# Patient Record
Sex: Female | Born: 1985 | Hispanic: No | Marital: Married | Smoking: Never smoker
Health system: Southern US, Community
[De-identification: ages and names within clinical notes are randomized; demographics above are authoritative.]

## PROBLEM LIST (undated history)

## (undated) DIAGNOSIS — Z789 Other specified health status: Secondary | ICD-10-CM

## (undated) HISTORY — DX: Other specified health status: Z78.9

## (undated) HISTORY — PX: NO PAST SURGERIES: SHX2092

---

## 2021-07-06 NOTE — L&D Delivery Note (Signed)
Delivery Note FHR 140s, moderate variability, occ variable decel.  Around midnight, pt was noted to be C/C/+2 station. I came in the room, lifted the covers, and baby's head was crowning.  After one push, at 12:04 AM a viable female was delivered via Vaginal, Spontaneous (Presentation:   Occiput Anterior).  APGAR:8/9, ; weight pending.  After 1 minute, the cord was clamped and cut. 20 units of pitocin diluted in 500cc LR was infused rapidly IV.  The placenta separated spontaneously and delivered via CCT and maternal pushing effort.  It was inspected and appears to be intact with a 3 VC  Anesthesia: Epidural Episiotomy: None Lacerations: none  Suture Repair:  Est. Blood Loss (mL):  375  Mom to postpartum.  Baby to Couplet care / Skin to Skin.  Sheena Lloyd 01/23/2022, 12:36 AM   .

## 2021-09-02 ENCOUNTER — Ambulatory Visit (INDEPENDENT_AMBULATORY_CARE_PROVIDER_SITE_OTHER): Payer: Self-pay | Admitting: Nurse Practitioner

## 2021-09-02 ENCOUNTER — Encounter: Payer: Self-pay | Admitting: Nurse Practitioner

## 2021-09-02 ENCOUNTER — Other Ambulatory Visit (HOSPITAL_COMMUNITY)
Admission: RE | Admit: 2021-09-02 | Discharge: 2021-09-02 | Disposition: A | Discharge status: Home / Self Care | Payer: Self-pay | Source: Ambulatory Visit | Attending: Student | Admitting: Student

## 2021-09-02 ENCOUNTER — Other Ambulatory Visit: Payer: Self-pay

## 2021-09-02 VITALS — BP 124/72 | HR 102 | Wt 205.0 lb

## 2021-09-02 DIAGNOSIS — O0990 Supervision of high risk pregnancy, unspecified, unspecified trimester: Secondary | ICD-10-CM | POA: Insufficient documentation

## 2021-09-02 DIAGNOSIS — Z3A14 14 weeks gestation of pregnancy: Secondary | ICD-10-CM

## 2021-09-02 DIAGNOSIS — O09521 Supervision of elderly multigravida, first trimester: Secondary | ICD-10-CM

## 2021-09-03 LAB — CBC/D/PLT+RPR+RH+ABO+RUBIGG...
Antibody Screen: NEGATIVE
Basophils Absolute: 0 10*3/uL (ref 0.0–0.2)
Basos: 0 %
EOS (ABSOLUTE): 0.1 10*3/uL (ref 0.0–0.4)
Eos: 1 %
HCV Ab: NONREACTIVE
HIV Screen 4th Generation wRfx: NONREACTIVE
Hematocrit: 36.6 % (ref 34.0–46.6)
Hemoglobin: 12.5 g/dL (ref 11.1–15.9)
Hepatitis B Surface Ag: NEGATIVE
Immature Grans (Abs): 0.1 10*3/uL (ref 0.0–0.1)
Immature Granulocytes: 1 %
Lymphocytes Absolute: 1.3 10*3/uL (ref 0.7–3.1)
Lymphs: 16 %
MCH: 31 pg (ref 26.6–33.0)
MCHC: 34.2 g/dL (ref 31.5–35.7)
MCV: 91 fL (ref 79–97)
Monocytes Absolute: 0.7 10*3/uL (ref 0.1–0.9)
Monocytes: 9 %
Neutrophils Absolute: 5.7 10*3/uL (ref 1.4–7.0)
Neutrophils: 73 %
Platelets: 269 10*3/uL (ref 150–450)
RBC: 4.03 x10E6/uL (ref 3.77–5.28)
RDW: 12.6 % (ref 11.7–15.4)
RPR Ser Ql: NONREACTIVE
Rh Factor: POSITIVE
Rubella Antibodies, IGG: 2.81 index (ref >0.99)
WBC: 7.9 10*3/uL (ref 3.4–10.8)

## 2021-09-03 LAB — HCV INTERPRETATION

## 2021-09-03 LAB — HEMOGLOBIN A1C
Est. average glucose Bld gHb Est-mCnc: 97 mg/dL
Hgb A1c MFr Bld: 5 % (ref 4.8–5.6)

## 2021-09-03 LAB — GC/CHLAMYDIA PROBE AMP (~~LOC~~) NOT AT ARMC
Chlamydia: NEGATIVE
Comment: NEGATIVE
Comment: NORMAL
Neisseria Gonorrhea: NEGATIVE

## 2021-09-03 NOTE — Progress Notes (Signed)
Subjective:   Sheena Lloyd is a 36 y.o. G4P3003 at [redacted]w[redacted]d by LMP being seen today for her first obstetrical visit.  Her obstetrical history is significant for advanced maternal age. Patient does intend to breast feed. Pregnancy history fully reviewed.  Patient reports no complaints.  HISTORY: OB History  Gravida Para Term Preterm AB Living  4 3 3  0 0 3  SAB IAB Ectopic Multiple Live Births  0 0 0 0 0    # Outcome Date GA Lbr Len/2nd Weight Sex Delivery Anes PTL Lv  4 Current           3 Term 07/03/14 [redacted]w[redacted]d   F Vag-Spont None    2 Term 09/17/10 [redacted]w[redacted]d   M Vag-Spont None    1 Term 12/14/05 [redacted]w[redacted]d   M Vag-Spont None     History reviewed. No pertinent past medical history. History reviewed. No pertinent surgical history. History reviewed. No pertinent family history. Social History   Tobacco Use   Smoking status: Never   Smokeless tobacco: Never  Substance Use Topics   Alcohol use: Never   Drug use: Never   No Known Allergies No current outpatient medications on file prior to visit.   No current facility-administered medications on file prior to visit.     Exam   Vitals:   09/02/21 1512  BP: 124/72  Pulse: (!) 102  Weight: 205 lb (93 kg)   Fetal Heart Rate (bpm): 160  Uterus:   19 week size  Pelvic Exam: Perineum: no hemorrhoids, normal perineum   Vulva: normal external genitalia, no lesions   Vagina:  normal mucosa, normal discharge   Cervix: no lesions and normal, pap smear done. Cervix friable   Adnexa: normal adnexa and no mass, fullness, tenderness   Bony Pelvis: average  System: General: well-developed, well-nourished female in no acute distress   Breast:  normal appearance, no masses or tenderness   Skin: normal coloration and turgor, no rashes   Neurologic: oriented, normal, negative, normal mood   Extremities: normal strength, tone, and muscle mass, ROM of all joints is normal   HEENT extraocular movement intact and sclera clear, anicteric    Mouth/Teeth deferred   Neck supple and no masses, normal thyroid   Cardiovascular: regular rate and rhythm   Respiratory:  no respiratory distress, normal breath sounds   Abdomen: soft, non-tender; no masses,  no organomegaly     Assessment:   Pregnancy: [id number] Patient Active Problem List   Diagnosis Date Noted   Supervision of high risk pregnancy, antepartum 09/02/2021     Plan:  1. Supervision of high risk pregnancy, antepartum Patient is [employer] Paper work from [employer] a Mom says that she is 18 weeks Patient reports LMP that makes her 14 weeks Fundal height is nearly to umbilicus and more consistent with 18-19 weeks Will get [place] now  - [place] MFM OB DETAIL +14 [place]; Future - Hemoglobin A1c - Genetic Screening - Culture, OB Urine - CBC/D/Plt+RPR+Rh+ABO+RubIgG... - Cytology - PAP( [place]) - GC/Chlamydia probe amp ([place])not at [facility] - [place] MFM OB COMP + 14 WK; Future  2. Advanced maternal age in multigravida, first trimester Genetic testing done  3. [redacted] weeks gestation of pregnancy    Initial labs drawn. Continue prenatal vitamins. Genetic Screening discussed, NIPS: ordered. Ultrasound discussed; fetal anatomic survey: ordered. Problem list reviewed and updated. The nature of [place] with multiple MDs and other Advanced Practice Providers  was explained to patient; also emphasized that residents, students are part of our team. Routine obstetric precautions reviewed. Return in about 4 weeks (around 09/30/2021) for in person ROB.  Total face-to-face time with patient: 40 minutes.  Over 50% of encounter was spent on counseling and coordination of care.     Earlie Server, FNP Family Nurse Practitioner, [facility] for [employer], [place] [facility] 09-02-21  5:00 pm

## 2021-09-05 LAB — CYTOLOGY - PAP
Comment: NEGATIVE
Comment: NEGATIVE
Diagnosis: UNDETERMINED — AB
HPV 16: NEGATIVE
HPV 18 / 45: NEGATIVE
High risk HPV: POSITIVE — AB

## 2021-09-06 LAB — URINE CULTURE, OB REFLEX

## 2021-09-06 LAB — CULTURE, OB URINE

## 2021-09-08 ENCOUNTER — Encounter: Payer: Self-pay | Admitting: Medical

## 2021-09-08 DIAGNOSIS — R8271 Bacteriuria: Secondary | ICD-10-CM | POA: Insufficient documentation

## 2021-09-12 ENCOUNTER — Other Ambulatory Visit: Payer: Self-pay | Admitting: *Deleted

## 2021-09-12 ENCOUNTER — Other Ambulatory Visit: Payer: Self-pay | Admitting: Family Medicine

## 2021-09-12 ENCOUNTER — Other Ambulatory Visit: Payer: Self-pay | Admitting: Nurse Practitioner

## 2021-09-12 ENCOUNTER — Other Ambulatory Visit: Payer: Self-pay

## 2021-09-12 ENCOUNTER — Ambulatory Visit: Payer: Self-pay | Attending: Nurse Practitioner

## 2021-09-12 ENCOUNTER — Ambulatory Visit: Payer: Self-pay | Admitting: *Deleted

## 2021-09-12 VITALS — BP 120/67 | HR 102 | Ht 61.0 in

## 2021-09-12 DIAGNOSIS — O99212 Obesity complicating pregnancy, second trimester: Secondary | ICD-10-CM

## 2021-09-12 DIAGNOSIS — O0990 Supervision of high risk pregnancy, unspecified, unspecified trimester: Secondary | ICD-10-CM

## 2021-09-12 DIAGNOSIS — E669 Obesity, unspecified: Secondary | ICD-10-CM

## 2021-09-12 DIAGNOSIS — O09522 Supervision of elderly multigravida, second trimester: Secondary | ICD-10-CM

## 2021-09-12 DIAGNOSIS — Z3A18 18 weeks gestation of pregnancy: Secondary | ICD-10-CM

## 2021-09-12 DIAGNOSIS — R8271 Bacteriuria: Secondary | ICD-10-CM

## 2021-09-12 DIAGNOSIS — Z362 Encounter for other antenatal screening follow-up: Secondary | ICD-10-CM

## 2021-09-30 ENCOUNTER — Encounter: Payer: Self-pay | Admitting: Family Medicine

## 2021-09-30 ENCOUNTER — Ambulatory Visit (INDEPENDENT_AMBULATORY_CARE_PROVIDER_SITE_OTHER): Payer: Self-pay | Admitting: Family Medicine

## 2021-09-30 ENCOUNTER — Other Ambulatory Visit: Payer: Self-pay

## 2021-09-30 VITALS — BP 115/71 | HR 94 | Wt 206.1 lb

## 2021-09-30 DIAGNOSIS — R87810 Cervical high risk human papillomavirus (HPV) DNA test positive: Secondary | ICD-10-CM

## 2021-09-30 DIAGNOSIS — O0990 Supervision of high risk pregnancy, unspecified, unspecified trimester: Secondary | ICD-10-CM

## 2021-09-30 DIAGNOSIS — R87610 Atypical squamous cells of undetermined significance on cytologic smear of cervix (ASC-US): Secondary | ICD-10-CM

## 2021-09-30 DIAGNOSIS — R8271 Bacteriuria: Secondary | ICD-10-CM

## 2021-09-30 NOTE — Patient Instructions (Signed)
BCCCP (Breast and Cervical Cancer Control Program)  [phone]   

## 2021-09-30 NOTE — Progress Notes (Signed)
? ?  Subjective:  ?Sheena Lloyd is a 36 y.o. G4P3003 at [redacted]w[redacted]d being seen today for ongoing prenatal care.  She is currently monitored for the following issues for this low-risk pregnancy and has Supervision of high risk pregnancy, antepartum; Group B streptococcal bacteriuria; and ASCUS with positive high risk HPV cervical on their problem list. ? ?Patient reports no complaints.  Contractions: Not present. Vag. Bleeding: None.  Movement: Present. Denies leaking of fluid.  ? ?The following portions of the patient's history were reviewed and updated as appropriate: allergies, current medications, past family history, past medical history, past social history, past surgical history and problem list. Problem list updated. ? ?Objective:  ? ?Vitals:  ? 09/30/21 1115  ?BP: 115/71  ?Pulse: 94  ?Weight: 206 lb 1.6 oz (93.5 kg)  ? ? ?Fetal Status: Fetal Heart Rate (bpm): 152   Movement: Present    ? ?General:  Alert, oriented and cooperative. Patient is in no acute distress.  ?Skin: Skin is warm and dry. No rash noted.   ?Cardiovascular: Normal heart rate noted  ?Respiratory: Normal respiratory effort, no problems with respiration noted  ?Abdomen: Soft, gravid, appropriate for gestational age. Pain/Pressure: Absent     ?Pelvic: Vag. Bleeding: None     ?Cervical exam deferred        ?Extremities: Normal range of motion.     ?Mental Status: Normal mood and affect. Normal behavior. Normal judgment and thought content.  ? ?Urinalysis:     ? ?Assessment and Plan:  ?Pregnancy: [id number] at [redacted]w[redacted]d ? ?1. Supervision of high risk pregnancy, antepartum ?BP and FHR normal ?Reviewed labs from last visit ? ?2. Group B streptococcal bacteriuria ?Noted on new OB labs ?Discussed with assistance of [place] interpreter ? ?3. ASCUS with positive high risk HPV cervical ?Reviewed chart, actually needs colposcopy ?Discussed pap smear and HPV result with assistance of [place] interpreter ?Given self pay will refer to BCCCP ? ?Preterm labor symptoms  and general obstetric precautions including but not limited to vaginal bleeding, contractions, leaking of fluid and fetal movement were reviewed in detail with the patient. ?Please refer to After Visit Summary for other counseling recommendations.  ?Return in about 4 weeks (around 10/28/2021) for [facility], ob visit. ? ? ?Clarnce Flock, MD ? ?

## 2021-10-06 ENCOUNTER — Other Ambulatory Visit: Payer: Self-pay

## 2021-10-06 ENCOUNTER — Ambulatory Visit: Payer: Self-pay

## 2021-10-13 ENCOUNTER — Ambulatory Visit: Payer: Self-pay | Admitting: *Deleted

## 2021-10-13 ENCOUNTER — Other Ambulatory Visit: Payer: Self-pay | Admitting: *Deleted

## 2021-10-13 ENCOUNTER — Encounter: Payer: Self-pay | Admitting: *Deleted

## 2021-10-13 ENCOUNTER — Ambulatory Visit: Payer: Self-pay | Attending: Obstetrics and Gynecology

## 2021-10-13 VITALS — BP 128/60 | HR 96

## 2021-10-13 DIAGNOSIS — O99212 Obesity complicating pregnancy, second trimester: Secondary | ICD-10-CM

## 2021-10-13 DIAGNOSIS — O09522 Supervision of elderly multigravida, second trimester: Secondary | ICD-10-CM

## 2021-10-13 DIAGNOSIS — O0990 Supervision of high risk pregnancy, unspecified, unspecified trimester: Secondary | ICD-10-CM

## 2021-10-13 DIAGNOSIS — R8271 Bacteriuria: Secondary | ICD-10-CM

## 2021-10-13 DIAGNOSIS — Z6841 Body Mass Index (BMI) 40.0 and over, adult: Secondary | ICD-10-CM

## 2021-10-13 DIAGNOSIS — E669 Obesity, unspecified: Secondary | ICD-10-CM

## 2021-10-13 DIAGNOSIS — Z362 Encounter for other antenatal screening follow-up: Secondary | ICD-10-CM

## 2021-10-13 DIAGNOSIS — Z3A23 23 weeks gestation of pregnancy: Secondary | ICD-10-CM

## 2021-10-28 ENCOUNTER — Encounter: Payer: Self-pay | Admitting: Family Medicine

## 2021-10-28 ENCOUNTER — Ambulatory Visit (INDEPENDENT_AMBULATORY_CARE_PROVIDER_SITE_OTHER): Payer: Self-pay | Admitting: Family Medicine

## 2021-10-28 VITALS — BP 107/69 | HR 86 | Wt 208.2 lb

## 2021-10-28 DIAGNOSIS — O0990 Supervision of high risk pregnancy, unspecified, unspecified trimester: Secondary | ICD-10-CM

## 2021-10-28 DIAGNOSIS — R87810 Cervical high risk human papillomavirus (HPV) DNA test positive: Secondary | ICD-10-CM

## 2021-10-28 DIAGNOSIS — R87610 Atypical squamous cells of undetermined significance on cytologic smear of cervix (ASC-US): Secondary | ICD-10-CM

## 2021-10-28 DIAGNOSIS — R8271 Bacteriuria: Secondary | ICD-10-CM

## 2021-10-28 NOTE — Patient Instructions (Signed)

## 2021-10-28 NOTE — Progress Notes (Signed)
? ?  Subjective:  ?Sheena Lloyd is a 36 y.o. G4P3003 at [redacted]w[redacted]d being seen today for ongoing prenatal care.  She is currently monitored for the following issues for this low-risk pregnancy and has Supervision of high risk pregnancy, antepartum; Group B streptococcal bacteriuria; and ASCUS with positive high risk HPV cervical on their problem list. ? ?Patient reports no complaints.  Contractions: Not present. Vag. Bleeding: None.  Movement: Present. Denies leaking of fluid.  ? ?The following portions of the patient's history were reviewed and updated as appropriate: allergies, current medications, past family history, past medical history, past social history, past surgical history and problem list. Problem list updated. ? ?Objective:  ? ?Vitals:  ? 10/28/21 0947  ?BP: 107/69  ?Pulse: 86  ?Weight: 208 lb 3.2 oz (94.4 kg)  ? ? ?Fetal Status: Fetal Heart Rate (bpm): 146   Movement: Present    ? ?General:  Alert, oriented and cooperative. Patient is in no acute distress.  ?Skin: Skin is warm and dry. No rash noted.   ?Cardiovascular: Normal heart rate noted  ?Respiratory: Normal respiratory effort, no problems with respiration noted  ?Abdomen: Soft, gravid, appropriate for gestational age. Pain/Pressure: Absent     ?Pelvic: Vag. Bleeding: None     ?Cervical exam deferred        ?Extremities: Normal range of motion.  Edema: None  ?Mental Status: Normal mood and affect. Normal behavior. Normal judgment and thought content.  ? ?Urinalysis:     ? ?Assessment and Plan:  ?Pregnancy: [id number] at [redacted]w[redacted]d ? ?1. Supervision of high risk pregnancy, antepartum ?BP and FHR normal ?Discussed fasting labs for next visit ? ?2. Group B streptococcal bacteriuria ?Ppx in labor ? ?3. ASCUS with positive high risk HPV cervical ?Refer to BCCCP post partum for colpo ? ?Preterm labor symptoms and general obstetric precautions including but not limited to vaginal bleeding, contractions, leaking of fluid and fetal movement were reviewed in detail  with the patient. ?Please refer to After Visit Summary for other counseling recommendations.  ?Return in 2 weeks (on 11/11/2021) for [facility], ob visit. ? ? ?Venora Maples, MD ? ?

## 2021-11-10 ENCOUNTER — Other Ambulatory Visit: Payer: Self-pay | Admitting: *Deleted

## 2021-11-10 DIAGNOSIS — O0990 Supervision of high risk pregnancy, unspecified, unspecified trimester: Secondary | ICD-10-CM

## 2021-11-12 ENCOUNTER — Ambulatory Visit (INDEPENDENT_AMBULATORY_CARE_PROVIDER_SITE_OTHER): Payer: Self-pay | Admitting: Obstetrics & Gynecology

## 2021-11-12 ENCOUNTER — Encounter: Payer: Self-pay | Admitting: Obstetrics & Gynecology

## 2021-11-12 ENCOUNTER — Other Ambulatory Visit: Payer: Self-pay

## 2021-11-12 VITALS — BP 110/62 | HR 88 | Wt 211.0 lb

## 2021-11-12 DIAGNOSIS — O0990 Supervision of high risk pregnancy, unspecified, unspecified trimester: Secondary | ICD-10-CM

## 2021-11-12 DIAGNOSIS — Z3A27 27 weeks gestation of pregnancy: Secondary | ICD-10-CM

## 2021-11-12 NOTE — Progress Notes (Signed)
? ?PRENATAL VISIT NOTE ? ?Subjective:  ?Sheena Lloyd is a 36 y.o. G4P3003 at [redacted]w[redacted]d being seen today for ongoing prenatal care.  Patient is Dutch-speaking only, phone interpreter used for this encounter. She is currently monitored for the following issues for this high-risk pregnancy and has Supervision of high risk pregnancy, antepartum; Group B streptococcal bacteriuria; and ASCUS with positive high risk HPV cervical on their problem list. ? ?Patient reports no complaints.  Contractions: Not present.  .  Movement: Present. Denies leaking of fluid.  ? ?The following portions of the patient's history were reviewed and updated as appropriate: allergies, current medications, past family history, past medical history, past social history, past surgical history and problem list.  ? ?Objective:  ? ?Vitals:  ? 11/12/21 0856  ?BP: 110/62  ?Pulse: 88  ?Weight: 211 lb (95.7 kg)  ? ? ?Fetal Status: Fetal Heart Rate (bpm):  132   Movement: Present    ? ?General:  Alert, oriented and cooperative. Patient is in no acute distress.  ?Skin: Skin is warm and dry. No rash noted.   ?Cardiovascular: Normal heart rate noted  ?Respiratory: Normal respiratory effort, no problems with respiration noted  ?Abdomen: Soft, gravid, appropriate for gestational age.  Pain/Pressure: Absent     ?Pelvic: Cervical exam deferred        ?Extremities: Normal range of motion.     ?Mental Status: Normal mood and affect. Normal behavior. Normal judgment and thought content.  ? ?Imaging: ?[place] MFM OB FOLLOW UP ? ?Result Date: 10/13/2021 ?----------------------------------------------------------------------  OBSTETRICS REPORT                       (Signed Final 10/13/2021 03:53 pm) ---------------------------------------------------------------------- Patient Info  ID #:       [id number]                          D.O.B.:  [birth year] (36 yrs)  Name:       Sheena Lloyd               Visit Date: 10/13/2021 03:31 pm  ---------------------------------------------------------------------- Performed By  Attending:        Johnell Comings MD         Ref. Address:     [address]                                                             [place], [place]  Performed By:     Jacob Moores BS,       Location[facility]         [facility], RVT  [facility] at                                                             [facility] for                                                             Women  Referred By:      [facility]                    for Women ---------------------------------------------------------------------- Orders  #  Description                           Code        Ordered By  1  [place] MFM OB FOLLOW UP                   [phone]    [place] ----------------------------------------------------------------------  #  Order #                     Accession #                Episode #  1  [id number]                   [id number]                 [id number] ---------------------------------------------------------------------- Indications  Obesity complicating pregnancy, second         O99.212  trimester (BMI 6)  Advanced maternal age multigravida 80+,        O2.522  second trimester (36)  [redacted] weeks gestation of pregnancy                Z3A.23  LR Nips ---------------------------------------------------------------------- Fetal Evaluation  Num Of Fetuses:         1  Fetal Heart Rate(bpm):  157  Cardiac Activity:       Observed  Presentation:           Breech  Placenta:               Anterior  P. Cord Insertion:      Not well visualized  Amniotic Fluid  AFI FV:      Within normal limits                              Largest Pocket(cm)                              3.7 ---------------------------------------------------------------------- Biometry  BPD:      53.5  mm     G. Age:  22w 2d         12  %    CI:         66.24   %    70 - 86  FL/HC:      19.1   %    19.2 - 20.8  HC:      210.8  mm     G. Age:  23w 1d         29  %    HC/AC:      1.12        1.05 - 1.21  AC:      188.5  mm     G. Age:  23w 4d         52  %    FL/BPD:     75.3   %    71 - 87  FL:       40.3  mm     G. Age:  23w 0d         30  %    FL/AC:      21.4   %    20 - 24  CER:      26.1  mm     G. Age:  23w 4d         82  %  LV:        8.2  mm  CM:        6.4  mm  Est. FW:     580  gm      1 lb 4 oz     42  % ---------------------------------------------------------------------- OB History  Gravidity:    4         Term:   3 ---------------------------------------------------------------------- Gestational Age  LMP:           20w 1d        Date:  05/25/21                 EDD:   03/01/22  U/S Today:     23w 0d                                        EDD:   02/09/22  Best:          23w 2d     Det. By:  U/S  (09/12/21)          EDD:   02/07/22 ---------------------------------------------------------------------- Anatomy  Cranium:               Appears normal         LVOT:                   Appears normal  Cavum:                 Appears normal         Aortic Arch:            Appears normal  Ventricles:            Appears normal         Ductal Arch:            Appears normal  Choroid Plexus:        Appears normal         Diaphragm:              Appears normal  Cerebellum:            Appears normal         Stomach:  Appears normal, left                                                                        sided  Posterior Fossa:       Appears normal         Abdomen:                Appears normal  Nuchal Fold:           Previously seen        Abdominal Wall:         Appears nml (cord                                                                        insert, abd wall)  Face:                  Appears normal         Cord Vessels:           Appears normal (3                         (orbits and  profile)                           vessel cord)  Lips:                  Previously seen        Kidneys:                Appear normal  Palate:                Not well visualized    Bladder:                Appears normal  Thoracic:              Appears normal         Spine:                  Previously seen  Heart:                 Appears normal         Upper Extremities:      Appears normal                         (4CH, axis, and                         situs)  RVOT:                  Appears normal         Lower Extremities:      Visualized  Other:  Female gender previously seen. VC, 3VV and 3VTV visualized. Nasal  bone, Lenses previously visualized. 5th digit visualized. Feet          visualized. Technically difficult due to maternal habitus and early GA. ---------------------------------------------------------------------- Cervix Uterus Adnexa  Cervix  Length:           3.45  cm.  Normal appearance by transabdominal scan.  Uterus  No abnormality visualized.  Right Ovary  Within normal limits.  Left Ovary  Within normal limits.  Cul De Sac  No free fluid seen.  Adnexa  No abnormality visualized. ---------------------------------------------------------------------- Comments  This patient was seen for a follow up exam as the views of  the fetal anatomy were unable to be fully visualized during  her last exam.  She denies any problems since her last exam.  She was informed that the fetal growth and amniotic fluid  level appears appropriate for her gestational age.  The views of the fetal anatomy were visualized today.  There  were no obvious anomalies noted.  The limitations of ultrasound in the detection of all anomalies  was discussed.  Due to advanced maternal age and maternal obesity with a  BMI of 38, a follow-up growth scan was scheduled in 6 weeks. ----------------------------------------------------------------------                   Johnell Comings, MD Electronically Signed Final Report   10/13/2021  03:53 pm ----------------------------------------------------------------------  ? ?Assessment and Plan:  ?Pregnancy: [id number] at [redacted]w[redacted]d ?1. [redacted] weeks gestation of pregnancy ?2. Supervision of high risk pregnancy, antepartum ?Third trimester labs checked,

## 2021-11-13 LAB — CBC
Hematocrit: 35.2 % (ref 34.0–46.6)
Hemoglobin: 11.9 g/dL (ref 11.1–15.9)
MCH: 31.1 pg (ref 26.6–33.0)
MCHC: 33.8 g/dL (ref 31.5–35.7)
MCV: 92 fL (ref 79–97)
Platelets: 234 10*3/uL (ref 150–450)
RBC: 3.83 x10E6/uL (ref 3.77–5.28)
RDW: 12.6 % (ref 11.7–15.4)
WBC: 7.8 10*3/uL (ref 3.4–10.8)

## 2021-11-13 LAB — GLUCOSE TOLERANCE, 2 HOURS W/ 1HR
Glucose, 1 hour: 117 mg/dL (ref 70–179)
Glucose, 2 hour: 95 mg/dL (ref 70–152)
Glucose, Fasting: 67 mg/dL — ABNORMAL LOW (ref 70–91)

## 2021-11-13 LAB — HIV ANTIBODY (ROUTINE TESTING W REFLEX): HIV Screen 4th Generation wRfx: NONREACTIVE

## 2021-11-13 LAB — RPR: RPR Ser Ql: NONREACTIVE

## 2021-11-24 ENCOUNTER — Other Ambulatory Visit: Payer: Self-pay | Admitting: *Deleted

## 2021-11-24 ENCOUNTER — Ambulatory Visit: Payer: Self-pay | Admitting: *Deleted

## 2021-11-24 ENCOUNTER — Encounter: Payer: Self-pay | Admitting: *Deleted

## 2021-11-24 ENCOUNTER — Ambulatory Visit: Payer: Self-pay | Attending: Obstetrics

## 2021-11-24 VITALS — BP 109/56 | HR 88

## 2021-11-24 DIAGNOSIS — Z6841 Body Mass Index (BMI) 40.0 and over, adult: Secondary | ICD-10-CM | POA: Insufficient documentation

## 2021-11-24 DIAGNOSIS — Z362 Encounter for other antenatal screening follow-up: Secondary | ICD-10-CM

## 2021-11-24 DIAGNOSIS — O09523 Supervision of elderly multigravida, third trimester: Secondary | ICD-10-CM

## 2021-11-24 DIAGNOSIS — O09522 Supervision of elderly multigravida, second trimester: Secondary | ICD-10-CM | POA: Insufficient documentation

## 2021-11-24 DIAGNOSIS — E669 Obesity, unspecified: Secondary | ICD-10-CM

## 2021-11-24 DIAGNOSIS — R8271 Bacteriuria: Secondary | ICD-10-CM | POA: Insufficient documentation

## 2021-11-24 DIAGNOSIS — O0990 Supervision of high risk pregnancy, unspecified, unspecified trimester: Secondary | ICD-10-CM

## 2021-11-24 DIAGNOSIS — Z6838 Body mass index (BMI) 38.0-38.9, adult: Secondary | ICD-10-CM

## 2021-11-24 DIAGNOSIS — O99213 Obesity complicating pregnancy, third trimester: Secondary | ICD-10-CM

## 2021-11-24 DIAGNOSIS — Z3A29 29 weeks gestation of pregnancy: Secondary | ICD-10-CM

## 2021-11-27 ENCOUNTER — Ambulatory Visit (INDEPENDENT_AMBULATORY_CARE_PROVIDER_SITE_OTHER): Payer: Self-pay | Admitting: Family Medicine

## 2021-11-27 ENCOUNTER — Encounter: Payer: Self-pay | Admitting: Family Medicine

## 2021-11-27 VITALS — BP 115/74 | HR 91 | Wt 214.2 lb

## 2021-11-27 DIAGNOSIS — Z23 Encounter for immunization: Secondary | ICD-10-CM

## 2021-11-27 DIAGNOSIS — O0990 Supervision of high risk pregnancy, unspecified, unspecified trimester: Secondary | ICD-10-CM

## 2021-11-27 DIAGNOSIS — R87810 Cervical high risk human papillomavirus (HPV) DNA test positive: Secondary | ICD-10-CM

## 2021-11-27 DIAGNOSIS — R8271 Bacteriuria: Secondary | ICD-10-CM

## 2021-11-27 DIAGNOSIS — R87610 Atypical squamous cells of undetermined significance on cytologic smear of cervix (ASC-US): Secondary | ICD-10-CM

## 2021-11-27 DIAGNOSIS — Z3A29 29 weeks gestation of pregnancy: Secondary | ICD-10-CM

## 2021-11-27 DIAGNOSIS — O99210 Obesity complicating pregnancy, unspecified trimester: Secondary | ICD-10-CM

## 2021-11-27 NOTE — Addendum Note (Signed)
Addended by: Marjo Bicker on: 11/27/2021 10:18 AM   Modules accepted: Orders

## 2021-11-27 NOTE — Progress Notes (Signed)
    Subjective:  Angelisse Collier is a 36 y.o. G4P3003 at [redacted]w[redacted]d being seen today for ongoing prenatal care.  She is currently monitored for the following issues for this low-risk pregnancy and has Supervision of high risk pregnancy, antepartum; Group B streptococcal bacteriuria; and ASCUS with positive high risk HPV cervical on their problem list.  Patient reports no complaints.  Contractions: Not present. Vag. Bleeding: None.  Movement: Present. Denies leaking of fluid.   The following portions of the patient's history were reviewed and updated as appropriate: allergies, current medications, past family history, past medical history, past social history, past surgical history and problem list.   Objective:   Vitals:   11/27/21 0849  BP: 115/74  Pulse: 91  Weight: 214 lb 3.2 oz (97.2 kg)    Fetal Status: Fetal Heart Rate (bpm): 140 Fundal Height: 29 cm Movement: Present   Feels vertex by leopolds (transverse in last [place])   General:  Alert, oriented and cooperative. Patient is in no acute distress.  Skin: Skin is warm and dry. No rash noted.   Cardiovascular: Normal heart rate noted  Respiratory: Normal respiratory effort, no problems with respiration noted  Abdomen: Soft, gravid, appropriate for gestational age. Pain/Pressure: Present     Pelvic:  Cervical exam deferred        Extremities: Normal range of motion.  Edema: None  Mental Status: Normal mood and affect. Normal behavior. Normal judgment and thought content.   Assessment and Plan:  Pregnancy: G4P3003 at [redacted]w[redacted]d  1. Supervision of high risk pregnancy, antepartum Doing well.  2. [redacted] weeks gestation of pregnancy TDAP given today. Discussed normal 3rd trimester labs.   3. Group B streptococcal bacteriuria PCN in labor.   4. ASCUS with positive high risk HPV cervical Needs pp colpo.   5. BMI 40 Pre-gavid 38. Follow OB US for 6/26.   Preterm labor symptoms and general obstetric precautions including but not limited to  vaginal bleeding, contractions, leaking of fluid and fetal movement were reviewed in detail with the patient. Please refer to After Visit Summary for other counseling recommendations.   Return in about 2 weeks (around 12/11/2021) for [place].   Patriciaann Clan, DO

## 2021-12-09 NOTE — Progress Notes (Signed)
   PRENATAL VISIT NOTE  Subjective:  Sheena Lloyd is a 36 y.o. G4P3003 at [redacted]w[redacted]d being seen today for ongoing prenatal care.  She is currently monitored for the following issues for this low-risk pregnancy and has Supervision of high risk pregnancy, antepartum; Group B streptococcal bacteriuria; and ASCUS with positive high risk HPV cervical on their problem list.  Patient reports no complaints.  Contractions: Not present. Vag. Bleeding: None.  Movement: Present. Denies leaking of fluid.   The following portions of the patient's history were reviewed and updated as appropriate: allergies, current medications, past family history, past medical history, past social history, past surgical history and problem list.   Objective:   Vitals:   12/11/21 1052  BP: (!) 107/53  Pulse: 97  Weight: 213 lb (96.6 kg)    Fetal Status: Fetal Heart Rate (bpm): 156 Fundal Height: 32 cm Movement: Present     General:  Alert, oriented and cooperative. Patient is in no acute distress.  Skin: Skin is warm and dry. No rash noted.   Cardiovascular: Normal heart rate noted  Respiratory: Normal respiratory effort, no problems with respiration noted  Abdomen: Soft, gravid, appropriate for gestational age.  Pain/Pressure: Absent     Pelvic: Cervical exam deferred        Extremities: Normal range of motion.  Edema: None  Mental Status: Normal mood and affect. Normal behavior. Normal judgment and thought content.   Assessment and Plan:  Pregnancy: G4P3003 at [redacted]w[redacted]d  1. [redacted] weeks gestation of pregnancy   2. Supervision of high risk pregnancy, antepartum     -confirmed pediatrician from [employer] list -she would like to take POPs pp -anticipatory guidance for next visit   Preterm labor symptoms and general obstetric precautions including but not limited to vaginal bleeding, contractions, leaking of fluid and fetal movement were reviewed in detail with the patient. Please refer to After Visit Summary for  other counseling recommendations.   Return in about 3 weeks (around 01/01/2022), or LROb.  Future Appointments  Date Time Provider Department Center  12/25/2021 10:55 AM Warner Mccreedy, MD [facility]  12/29/2021 11:15 AM [facility] NURSE [facility]  12/29/2021 11:30 AM [facility] US3 [facility]  01/16/2022 10:15 AM Carlynn Herald, CNM [facility]  01/23/2022 10:35 AM Carlynn Herald, CNM [facility]  01/30/2022 10:35 AM Reva Bores, MD [facility]  02/05/2022  9:35 AM Bernerd Limbo, CNM [facility]    Marylene Land, CNM

## 2021-12-11 ENCOUNTER — Ambulatory Visit (INDEPENDENT_AMBULATORY_CARE_PROVIDER_SITE_OTHER): Payer: Self-pay | Admitting: Student

## 2021-12-11 VITALS — BP 107/53 | HR 97 | Wt 213.0 lb

## 2021-12-11 DIAGNOSIS — O0990 Supervision of high risk pregnancy, unspecified, unspecified trimester: Secondary | ICD-10-CM

## 2021-12-11 DIAGNOSIS — Z3A31 31 weeks gestation of pregnancy: Secondary | ICD-10-CM

## 2021-12-25 ENCOUNTER — Other Ambulatory Visit: Payer: Self-pay

## 2021-12-25 ENCOUNTER — Ambulatory Visit (INDEPENDENT_AMBULATORY_CARE_PROVIDER_SITE_OTHER): Payer: Self-pay | Admitting: Family Medicine

## 2021-12-25 VITALS — BP 118/67 | HR 96 | Wt 214.0 lb

## 2021-12-25 DIAGNOSIS — Z3A33 33 weeks gestation of pregnancy: Secondary | ICD-10-CM

## 2021-12-25 DIAGNOSIS — O0990 Supervision of high risk pregnancy, unspecified, unspecified trimester: Secondary | ICD-10-CM

## 2021-12-25 DIAGNOSIS — Z3009 Encounter for other general counseling and advice on contraception: Secondary | ICD-10-CM

## 2021-12-29 ENCOUNTER — Encounter: Payer: Self-pay | Admitting: *Deleted

## 2021-12-29 ENCOUNTER — Other Ambulatory Visit: Payer: Self-pay | Admitting: *Deleted

## 2021-12-29 ENCOUNTER — Ambulatory Visit: Payer: Self-pay | Admitting: *Deleted

## 2021-12-29 ENCOUNTER — Ambulatory Visit: Payer: Self-pay | Attending: Obstetrics and Gynecology

## 2021-12-29 VITALS — BP 109/65 | HR 90

## 2021-12-29 DIAGNOSIS — R8271 Bacteriuria: Secondary | ICD-10-CM

## 2021-12-29 DIAGNOSIS — O09523 Supervision of elderly multigravida, third trimester: Secondary | ICD-10-CM | POA: Insufficient documentation

## 2021-12-29 DIAGNOSIS — O365931 Maternal care for other known or suspected poor fetal growth, third trimester, fetus 1: Secondary | ICD-10-CM

## 2021-12-29 DIAGNOSIS — O99213 Obesity complicating pregnancy, third trimester: Secondary | ICD-10-CM

## 2021-12-29 DIAGNOSIS — Z3A34 34 weeks gestation of pregnancy: Secondary | ICD-10-CM

## 2021-12-29 DIAGNOSIS — O0990 Supervision of high risk pregnancy, unspecified, unspecified trimester: Secondary | ICD-10-CM

## 2021-12-29 DIAGNOSIS — E669 Obesity, unspecified: Secondary | ICD-10-CM

## 2021-12-29 DIAGNOSIS — Z6838 Body mass index (BMI) 38.0-38.9, adult: Secondary | ICD-10-CM | POA: Insufficient documentation

## 2022-01-08 ENCOUNTER — Encounter: Payer: Self-pay | Admitting: Student

## 2022-01-15 ENCOUNTER — Other Ambulatory Visit (HOSPITAL_COMMUNITY)
Admission: RE | Admit: 2022-01-15 | Discharge: 2022-01-15 | Disposition: A | Discharge status: Home / Self Care | Payer: Self-pay | Source: Ambulatory Visit | Attending: Certified Nurse Midwife | Admitting: Certified Nurse Midwife

## 2022-01-15 ENCOUNTER — Other Ambulatory Visit: Payer: Self-pay

## 2022-01-15 ENCOUNTER — Ambulatory Visit (INDEPENDENT_AMBULATORY_CARE_PROVIDER_SITE_OTHER): Payer: Self-pay | Admitting: Student

## 2022-01-15 VITALS — BP 118/71 | HR 90 | Wt 216.0 lb

## 2022-01-15 DIAGNOSIS — O0990 Supervision of high risk pregnancy, unspecified, unspecified trimester: Secondary | ICD-10-CM | POA: Insufficient documentation

## 2022-01-15 DIAGNOSIS — Z3A36 36 weeks gestation of pregnancy: Secondary | ICD-10-CM

## 2022-01-15 DIAGNOSIS — O0993 Supervision of high risk pregnancy, unspecified, third trimester: Secondary | ICD-10-CM

## 2022-01-15 LAB — OB RESULTS CONSOLE GBS: GBS: POSITIVE

## 2022-01-15 NOTE — Progress Notes (Signed)
   PRENATAL VISIT NOTE  Subjective:  Sheena Lloyd is a 36 y.o. G4P3003 at [redacted]w[redacted]d being seen today for ongoing prenatal care.  She is currently monitored for the following issues for this low-risk pregnancy and has Supervision of high risk pregnancy, antepartum; Group B streptococcal bacteriuria; and ASCUS with positive high risk HPV cervical on their problem list.  Patient reports no complaints.  Contractions: Irritability. Vag. Bleeding: None.  Movement: Present. Denies leaking of fluid.   The following portions of the patient's history were reviewed and updated as appropriate: allergies, current medications, past family history, past medical history, past social history, past surgical history and problem list.   Objective:   Vitals:   01/15/22 1352  BP: 118/71  Pulse: 90  Weight: 216 lb (98 kg)    Fetal Status: Fetal Heart Rate (bpm): 140   Movement: Present     General:  Alert, oriented and cooperative. Patient is in no acute distress.  Skin: Skin is warm and dry. No rash noted.   Cardiovascular: Normal heart rate noted  Respiratory: Normal respiratory effort, no problems with respiration noted  Abdomen: Soft, gravid, appropriate for gestational age.  Pain/Pressure: Present     Pelvic: Cervical exam deferred        Extremities: Normal range of motion.  Edema: Mild pitting, slight indentation  Mental Status: Normal mood and affect. Normal behavior. Normal judgment and thought content.   Assessment and Plan:  Pregnancy: G4P3003 at [redacted]w[redacted]d 1. Supervision of high risk pregnancy, antepartum -doing well, GC CT collected -has follow up US on 7/18 - Cervicovaginal ancillary only( [facility])  Preterm labor symptoms and general obstetric precautions including but not limited to vaginal bleeding, contractions, leaking of fluid and fetal movement were reviewed in detail with the patient. Please refer to After Visit Summary for other counseling recommendations.   No follow-ups on  file.  Future Appointments  Date Time Provider Department Center  01/20/2022  3:30 PM [facility] NURSE [facility]  01/20/2022  3:45 PM [facility]  01/23/2022 10:35 AM Carlynn Herald, CNM [facility]  01/30/2022 10:35 AM Reva Bores, MD [facility]  02/05/2022  9:35 AM Bernerd Limbo, CNM [facility]    Marylene Land, CNM

## 2022-01-16 ENCOUNTER — Encounter: Payer: Self-pay | Admitting: Certified Nurse Midwife

## 2022-01-20 ENCOUNTER — Other Ambulatory Visit: Payer: Self-pay | Admitting: Obstetrics and Gynecology

## 2022-01-20 ENCOUNTER — Ambulatory Visit: Payer: Self-pay | Admitting: *Deleted

## 2022-01-20 ENCOUNTER — Ambulatory Visit: Payer: Self-pay | Attending: Obstetrics and Gynecology

## 2022-01-20 VITALS — BP 119/71 | HR 95

## 2022-01-20 DIAGNOSIS — O0990 Supervision of high risk pregnancy, unspecified, unspecified trimester: Secondary | ICD-10-CM | POA: Insufficient documentation

## 2022-01-20 DIAGNOSIS — O365931 Maternal care for other known or suspected poor fetal growth, third trimester, fetus 1: Secondary | ICD-10-CM

## 2022-01-20 DIAGNOSIS — O365930 Maternal care for other known or suspected poor fetal growth, third trimester, not applicable or unspecified: Secondary | ICD-10-CM

## 2022-01-20 DIAGNOSIS — Z3A37 37 weeks gestation of pregnancy: Secondary | ICD-10-CM

## 2022-01-20 DIAGNOSIS — E669 Obesity, unspecified: Secondary | ICD-10-CM

## 2022-01-20 DIAGNOSIS — R8271 Bacteriuria: Secondary | ICD-10-CM | POA: Insufficient documentation

## 2022-01-20 DIAGNOSIS — O4103X0 Oligohydramnios, third trimester, not applicable or unspecified: Secondary | ICD-10-CM

## 2022-01-20 DIAGNOSIS — O09523 Supervision of elderly multigravida, third trimester: Secondary | ICD-10-CM

## 2022-01-20 DIAGNOSIS — O99213 Obesity complicating pregnancy, third trimester: Secondary | ICD-10-CM

## 2022-01-21 ENCOUNTER — Other Ambulatory Visit: Payer: Self-pay | Admitting: *Deleted

## 2022-01-21 DIAGNOSIS — R638 Other symptoms and signs concerning food and fluid intake: Secondary | ICD-10-CM

## 2022-01-21 DIAGNOSIS — O365990 Maternal care for other known or suspected poor fetal growth, unspecified trimester, not applicable or unspecified: Secondary | ICD-10-CM

## 2022-01-21 DIAGNOSIS — O288 Other abnormal findings on antenatal screening of mother: Secondary | ICD-10-CM

## 2022-01-21 DIAGNOSIS — O09523 Supervision of elderly multigravida, third trimester: Secondary | ICD-10-CM

## 2022-01-21 LAB — CERVICOVAGINAL ANCILLARY ONLY
Chlamydia: NEGATIVE
Comment: NEGATIVE
Comment: NEGATIVE
Comment: NORMAL
Neisseria Gonorrhea: NEGATIVE
Trichomonas: NEGATIVE

## 2022-01-22 ENCOUNTER — Ambulatory Visit: Payer: Self-pay | Attending: Obstetrics and Gynecology | Admitting: *Deleted

## 2022-01-22 ENCOUNTER — Ambulatory Visit (HOSPITAL_BASED_OUTPATIENT_CLINIC_OR_DEPARTMENT_OTHER): Payer: Self-pay

## 2022-01-22 ENCOUNTER — Inpatient Hospital Stay (HOSPITAL_COMMUNITY): Payer: Medicaid Other | Admitting: Anesthesiology

## 2022-01-22 ENCOUNTER — Inpatient Hospital Stay (HOSPITAL_COMMUNITY)
Admission: AD | Admit: 2022-01-22 | Discharge: 2022-01-24 | DRG: 807 | Disposition: A | Discharge status: Home / Self Care | Payer: Medicaid Other | Attending: Obstetrics and Gynecology | Admitting: Obstetrics and Gynecology

## 2022-01-22 ENCOUNTER — Encounter (HOSPITAL_COMMUNITY): Payer: Self-pay | Admitting: Obstetrics and Gynecology

## 2022-01-22 ENCOUNTER — Ambulatory Visit: Payer: Self-pay | Attending: Maternal & Fetal Medicine | Admitting: Maternal & Fetal Medicine

## 2022-01-22 VITALS — BP 116/66 | HR 87

## 2022-01-22 DIAGNOSIS — Z3A37 37 weeks gestation of pregnancy: Secondary | ICD-10-CM

## 2022-01-22 DIAGNOSIS — O288 Other abnormal findings on antenatal screening of mother: Secondary | ICD-10-CM

## 2022-01-22 DIAGNOSIS — O4103X0 Oligohydramnios, third trimester, not applicable or unspecified: Secondary | ICD-10-CM | POA: Diagnosis present

## 2022-01-22 DIAGNOSIS — Z6841 Body Mass Index (BMI) 40.0 and over, adult: Secondary | ICD-10-CM

## 2022-01-22 DIAGNOSIS — O0990 Supervision of high risk pregnancy, unspecified, unspecified trimester: Secondary | ICD-10-CM

## 2022-01-22 DIAGNOSIS — O09523 Supervision of elderly multigravida, third trimester: Secondary | ICD-10-CM | POA: Insufficient documentation

## 2022-01-22 DIAGNOSIS — O99213 Obesity complicating pregnancy, third trimester: Secondary | ICD-10-CM | POA: Insufficient documentation

## 2022-01-22 DIAGNOSIS — O4100X0 Oligohydramnios, unspecified trimester, not applicable or unspecified: Secondary | ICD-10-CM

## 2022-01-22 DIAGNOSIS — O99214 Obesity complicating childbirth: Secondary | ICD-10-CM | POA: Diagnosis present

## 2022-01-22 DIAGNOSIS — R8271 Bacteriuria: Secondary | ICD-10-CM

## 2022-01-22 DIAGNOSIS — O99824 Streptococcus B carrier state complicating childbirth: Secondary | ICD-10-CM | POA: Diagnosis not present

## 2022-01-22 DIAGNOSIS — R638 Other symptoms and signs concerning food and fluid intake: Secondary | ICD-10-CM

## 2022-01-22 DIAGNOSIS — O365990 Maternal care for other known or suspected poor fetal growth, unspecified trimester, not applicable or unspecified: Secondary | ICD-10-CM

## 2022-01-22 LAB — CBC
HCT: 32.2 % — ABNORMAL LOW (ref 36.0–46.0)
Hemoglobin: 10.7 g/dL — ABNORMAL LOW (ref 12.0–15.0)
MCH: 30.1 pg (ref 26.0–34.0)
MCHC: 33.2 g/dL (ref 30.0–36.0)
MCV: 90.4 fL (ref 80.0–100.0)
Platelets: 243 10*3/uL (ref 150–400)
RBC: 3.56 MIL/uL — ABNORMAL LOW (ref 3.87–5.11)
RDW: 13.9 % (ref 11.5–15.5)
WBC: 7.9 10*3/uL (ref 4.0–10.5)
nRBC: 0 % (ref 0.0–0.2)

## 2022-01-22 LAB — TYPE AND SCREEN
ABO/RH(D): O POS
Antibody Screen: NEGATIVE

## 2022-01-22 MED ORDER — ONDANSETRON HCL 4 MG/2ML IJ SOLN: 4.0000 mg | Freq: Four times a day (QID) | INTRAMUSCULAR | Status: DC | PRN

## 2022-01-22 MED ORDER — PHENYLEPHRINE 80 MCG/ML (10ML) SYRINGE FOR IV PUSH (FOR BLOOD PRESSURE SUPPORT): 80.0000 ug | PREFILLED_SYRINGE | INTRAVENOUS | Status: DC | PRN

## 2022-01-22 MED ORDER — ACETAMINOPHEN 325 MG PO TABS: 650.0000 mg | ORAL_TABLET | ORAL | Status: DC | PRN

## 2022-01-22 MED ORDER — EPHEDRINE 5 MG/ML INJ: 10.0000 mg | INTRAVENOUS | Status: DC | PRN

## 2022-01-22 MED ORDER — OXYTOCIN-SODIUM CHLORIDE 30-0.9 UT/500ML-% IV SOLN
1.0000 m[IU]/min | INTRAVENOUS | Status: DC
  Administered 2022-01-22: 2 m[IU]/min via INTRAVENOUS

## 2022-01-22 MED ORDER — TERBUTALINE SULFATE 1 MG/ML IJ SOLN: 0.2500 mg | Freq: Once | INTRAMUSCULAR | Status: DC | PRN

## 2022-01-22 MED ORDER — LACTATED RINGERS IV SOLN: INTRAVENOUS | Status: DC

## 2022-01-22 MED ORDER — LIDOCAINE HCL (PF) 1 % IJ SOLN: 30.0000 mL | INTRAMUSCULAR | Status: DC | PRN

## 2022-01-22 MED ORDER — SODIUM CHLORIDE 0.9 % IV SOLN
5.0000 10*6.[IU] | Freq: Once | INTRAVENOUS | Status: AC
  Administered 2022-01-22: 5 10*6.[IU] via INTRAVENOUS
  Filled 2022-01-22: qty 5

## 2022-01-22 MED ORDER — LACTATED RINGERS IV SOLN
500.0000 mL | Freq: Once | INTRAVENOUS | Status: AC
  Administered 2022-01-22: 500 mL via INTRAVENOUS

## 2022-01-22 MED ORDER — MISOPROSTOL 50MCG HALF TABLET: 50.0000 ug | ORAL_TABLET | ORAL | Status: DC | PRN

## 2022-01-22 MED ORDER — OXYTOCIN-SODIUM CHLORIDE 30-0.9 UT/500ML-% IV SOLN: 2.5000 [IU]/h | INTRAVENOUS | Status: DC

## 2022-01-22 MED ORDER — OXYTOCIN BOLUS FROM INFUSION
333.0000 mL | Freq: Once | INTRAVENOUS | Status: AC
  Administered 2022-01-23: 333 mL via INTRAVENOUS

## 2022-01-22 MED ORDER — DIPHENHYDRAMINE HCL 50 MG/ML IJ SOLN: 12.5000 mg | INTRAMUSCULAR | Status: DC | PRN

## 2022-01-22 MED ORDER — FENTANYL CITRATE (PF) 100 MCG/2ML IJ SOLN: 100.0000 ug | INTRAMUSCULAR | Status: DC | PRN

## 2022-01-22 MED ORDER — PENICILLIN G POT IN DEXTROSE 60000 UNIT/ML IV SOLN
3.0000 10*6.[IU] | INTRAVENOUS | Status: DC
  Administered 2022-01-22 (×2): 3 10*6.[IU] via INTRAVENOUS
  Filled 2022-01-22 (×5): qty 50

## 2022-01-22 MED ORDER — LACTATED RINGERS IV SOLN
500.0000 mL | INTRAVENOUS | Status: DC | PRN
  Administered 2022-01-22: 500 mL via INTRAVENOUS

## 2022-01-22 MED ORDER — HYDROXYZINE HCL 50 MG PO TABS: 50.0000 mg | ORAL_TABLET | Freq: Four times a day (QID) | ORAL | Status: DC | PRN

## 2022-01-22 MED ORDER — FENTANYL-BUPIVACAINE-NACL 0.5-0.125-0.9 MG/250ML-% EP SOLN
12.0000 mL/h | EPIDURAL | Status: DC | PRN
  Administered 2022-01-22: 12 mL/h via EPIDURAL
  Filled 2022-01-22: qty 250

## 2022-01-22 NOTE — Anesthesia Preprocedure Evaluation (Signed)
Anesthesia Evaluation  Patient identified by MRN, date of birth, ID band Patient awake    Reviewed: Allergy & Precautions, Patient's Chart, lab work & pertinent test results  Airway Mallampati: II  TM Distance: >3 FB Neck ROM: Full    Dental no notable dental hx.    Pulmonary neg pulmonary ROS,    Pulmonary exam normal breath sounds clear to auscultation       Cardiovascular negative cardio ROS Normal cardiovascular exam Rhythm:Regular Rate:Normal     Neuro/Psych negative neurological ROS     GI/Hepatic negative GI ROS, Neg liver ROS,   Endo/Other  Morbid obesity  Renal/GU negative Renal ROS     Musculoskeletal negative musculoskeletal ROS (+)   Abdominal   Peds  Hematology negative hematology ROS (+)   Anesthesia Other Findings   Reproductive/Obstetrics (+) Pregnancy                             Anesthesia Physical Anesthesia Plan  ASA: 3  Anesthesia Plan: Epidural   Post-op Pain Management:    Induction:   PONV Risk Score and Plan:   Airway Management Planned:   Additional Equipment:   Intra-op Plan:   Post-operative Plan:   Informed Consent: I have reviewed the patients History and Physical, chart, labs and discussed the procedure including the risks, benefits and alternatives for the proposed anesthesia with the patient or authorized representative who has indicated his/her understanding and acceptance.       Plan Discussed with:   Anesthesia Plan Comments:         Anesthesia Quick Evaluation

## 2022-01-22 NOTE — Progress Notes (Signed)
MFM Brief Note  [name] is here at 101 w 5 d for follow assessment of amniotic fluid volume. The prior exam she had oligohydramnios.  She is seen at the request of Dr. Merian Capron.  Antenatal testing performed given oligohydramnios The biophysical profile was 6/8 with good fetal movement and persistent oligohydramnios  Given the term gestation and persistent oligohydramnios I recommended tat [name] go to MAU for delivery.  I reviewed the risk and benefits of delivery, including cesarean delivery and stillbirth.  I discussed this plan of care with Dr. Crissie Reese.  I spent 20 minutes with > 50% in face to face consultation.  Novella Olive, MD

## 2022-01-22 NOTE — MAU Note (Signed)
Sheena Lloyd is a 36 y.o. at [redacted]w[redacted]d here in MAU reporting: was told fluid is low. Recheck today, fluid is still low. Sent in for induction.  Spotting yesterday, no leaking, occ cramp in lower abd,rare- this morning- few  hrs apart, brief Baby is moving Onset of complaint: Tues Pain score: 2 Vitals:   01/22/22 1201  BP: 119/69  Pulse: 94  Resp: 18  Temp: 99.1 F (37.3 C)  SpO2: 99%     CVK:FMMCR on Lab orders placed from triage:  none

## 2022-01-22 NOTE — H&P (Signed)
OBSTETRIC ADMISSION HISTORY AND PHYSICAL  Sheena Lloyd is a 36 y.o. female [phone] with IUP at [redacted]w[redacted]d by 18 wk U/S presenting for IOL for oligohydramnios, BPP 6/8 and spotting.   Reports fetal movement. Reports vaginal spotting.  She received her prenatal care at [facility].  Support person in labor: spouse  Ultrasounds Anatomy U/S: normal EFW @ 37.3 wks:    2905  gm  6 lb 6 oz, 29  %  Prenatal History/Complications: AMA Obesity GBS Bacteriuria Oligohydramnios (newly diagnosed)  OB BOX: Nursing Staff Provider  Office Location  [facility] Dating   18wk [place]  [facility] Model [ X] Traditional [ ]  Centering [ ]  Mom-Baby Dyad    Language  English  Anatomy  normal  Flu Vaccine   Genetic/Carrier Screen  NIPS: low risk female AFP:   declined Horizon:  TDaP Vaccine  11/27/21 Hgb A1C or  GTT Early - normal Third trimester Nml 2 hr GTT  COVID Vaccine    LAB RESULTS   Rhogam  n/a Blood Type O/Positive/-- (02/28 1622)   Baby Feeding Plan Both, Breast & Bottle Antibody Negative (02/28 1622)  Contraception POPS Rubella 2.81 (02/28 1622)  Circumcision N/A  RPR Non Reactive (02/28 1622)   Pediatrician   [facility] at [facility]  HBsAg Negative (02/28 1622)   Support Person Husband  HCVAb  Negative 09-02-21  Prenatal Classes No HIV Non Reactive (02/28 1622)       GBS  POSITIVE    Pap ASCUS with +HPV 09/02/21 - colpo PP       DME Rx [ ]  BP cuff [ ]  Weight Scale Waterbirth  [ ]  Class [ ]  Consent [ ]  CNM visit  PHQ9 & GAD7 [  ] new OB [  ] 28 weeks  [  ] 36 weeks Induction  [ ]  Orders Entered [ ] Foley Y/N   Past Medical History: Past Medical History:  Diagnosis Date   Medical history non-contributory     Past Surgical History: Past Surgical History:  Procedure Laterality Date   NO PAST SURGERIES      Obstetrical History: OB History     Gravida  4   Para  3   Term  3   Preterm      AB      Living  3      SAB      IAB      Ectopic      Multiple       Live Births              Social History: Social History   Socioeconomic History   Marital status: Married    Spouse name: Not on file   Number of children: Not on file   Years of education: Not on file   Highest education level: Not on file  Occupational History   Not on file  Tobacco Use   Smoking status: Never   Smokeless tobacco: Never  Vaping Use   Vaping Use: Never used  Substance and Sexual Activity   Alcohol use: Never   Drug use: Never   Sexual activity: Yes  Other Topics Concern   Not on file  Social History Narrative   Not on file   Social Determinants of Health   Financial Resource Strain: Not on file  Food Insecurity: No Food Insecurity (01/15/2022)   Hunger Vital Sign    Worried About Running Out of Food in the Last Year: Never true  Ran Out of Food in the Last Year: Never true  Transportation Needs: No Transportation Needs (01/15/2022)   PRAPARE - Administrator, Civil Service (Medical): No    Lack of Transportation (Non-Medical): No  Physical Activity: Not on file  Stress: Not on file  Social Connections: Not on file    Family History: Family History  Problem Relation Age of Onset   Heart disease Father    Diabetes Neg Hx    Hypertension Neg Hx    Stroke Neg Hx    Asthma Neg Hx    Cancer Neg Hx    Birth defects Neg Hx     Allergies: No Known Allergies  Medications Prior to Admission  Medication Sig Dispense Refill Last Dose   Prenatal MV & Min w/FA-DHA (PRENATAL ADULT GUMMY/DHA/FA PO) Take by mouth.   01/21/2022     Review of Systems  All systems reviewed and negative except as stated in HPI  Blood pressure 110/67, pulse 78, temperature 99.1 F (37.3 C), temperature source Oral, resp. rate 20, height 5\' 2"  (1.575 m), weight 97.4 kg, last menstrual period 05/25/2021, SpO2 99 %. General appearance: alert, cooperative, and no distress Lungs: no respiratory distress Heart: regular rate  Abdomen: soft, non-tender;  gravid Pelvic: adequate Extremities: Homans sign is negative, no sign of DVT Presentation: cephalic Fetal monitoring: Baseline rate 135 bpm   Variability moderate  Accelerations present   Decelerations none Uterine activity: 1 UC every 30 mins Dilation: 1.5 Effacement (%): Thick Exam by:: [email or web address], CNM FB inserted with speculum -- please see separate procedure note  Prenatal labs: ABO, Rh: O/Positive/-- (02/28 1622) Antibody: Negative (02/28 1622) Rubella: 2.81 (02/28 1622) RPR: Non Reactive (05/10 0844)  HBsAg: Negative (02/28 1622)  HIV: Non Reactive (05/10 0844)  GBS:  POSITIVE Glucola: Normal Genetic screening:  LR Female on Panorama  Prenatal Transfer Tool  Maternal Diabetes: No Genetic Screening: Normal Maternal Ultrasounds/Referrals: Normal Fetal Ultrasounds or other Referrals:  None Maternal Substance Abuse:  No Significant Maternal Medications:  None Significant Maternal Lab Results: Group B Strep negative  No results found for this or any previous visit (from the past 24 hour(s)).  Patient Active Problem List   Diagnosis Date Noted   ASCUS with positive high risk HPV cervical 09/30/2021   Group B streptococcal bacteriuria 09/08/2021   Supervision of high risk pregnancy, antepartum 09/02/2021    Assessment/Plan:  Sheena Lloyd is a 36 y.o. G4P3003 at [redacted]w[redacted]d here for IOL for oligohydramnios, AMA, BPP 6/8, and spotting  Labor: IOL -- FB in place.  -- Will utilize Cytotec once on L&D unit.  -- Advance to Pitocin and AROM prn -- pain control: planning epidural prn  Fetal Wellbeing: EFW 7 lbs by Leopold's. Cephalic by SVE.  -- GBS (POS) >> PCN started -- continuous fetal monitoring - category 1   Postpartum Planning -- breast/bottle -- PoPs for contraception    8/8, CNM  01/22/2022, 12:54 PM

## 2022-01-22 NOTE — Progress Notes (Signed)
Pt finally up to Labor and Delivery.  Balloon out.  Cx 5/50/-2.  FHR Cat 1. Rare ctx.  AROM w/clear fluid. Will start pitocin.  Pt has never had an epidural, would like to try one w/this labor.

## 2022-01-22 NOTE — Anesthesia Procedure Notes (Signed)
Epidural Patient location during procedure: OB Start time: 01/22/2022 7:55 PM End time: 01/22/2022 8:19 PM  Staffing Anesthesiologist: Lewie Loron, MD Performed: anesthesiologist   Preanesthetic Checklist Completed: patient identified, IV checked, risks and benefits discussed, monitors and equipment checked, pre-op evaluation and timeout performed  Epidural Patient position: sitting Prep: DuraPrep and site prepped and draped Patient monitoring: heart rate, continuous pulse ox and blood pressure Approach: midline Location: L2-L3 Injection technique: LOR air and LOR saline  Needle:  Needle type: Tuohy  Needle gauge: 17 G Needle length: 9 cm Needle insertion depth: 8 cm Catheter type: closed end flexible Catheter size: 19 Gauge Catheter at skin depth: 14 cm Test dose: negative  Assessment Sensory level: T8 Events: blood aspirated, injection not painful, no injection resistance, no paresthesia and negative IV test  Additional Notes +RBCs. Epidural removed. LOR re-obtained, catheter threaded easily. -RBC aspiration with second attempt.Reason for block:procedure for pain

## 2022-01-23 ENCOUNTER — Encounter: Payer: Self-pay | Admitting: Certified Nurse Midwife

## 2022-01-23 ENCOUNTER — Other Ambulatory Visit: Payer: Self-pay

## 2022-01-23 ENCOUNTER — Encounter (HOSPITAL_COMMUNITY): Payer: Self-pay | Admitting: Family Medicine

## 2022-01-23 ENCOUNTER — Ambulatory Visit: Payer: Self-pay

## 2022-01-23 DIAGNOSIS — O4103X0 Oligohydramnios, third trimester, not applicable or unspecified: Secondary | ICD-10-CM

## 2022-01-23 DIAGNOSIS — Z3A37 37 weeks gestation of pregnancy: Secondary | ICD-10-CM

## 2022-01-23 DIAGNOSIS — O99824 Streptococcus B carrier state complicating childbirth: Secondary | ICD-10-CM

## 2022-01-23 LAB — RPR: RPR Ser Ql: NONREACTIVE

## 2022-01-23 MED ORDER — COCONUT OIL OIL: 1.0000 | TOPICAL_OIL | Status: DC | PRN

## 2022-01-23 MED ORDER — ONDANSETRON HCL 4 MG PO TABS: 4.0000 mg | ORAL_TABLET | ORAL | Status: DC | PRN

## 2022-01-23 MED ORDER — MEASLES, MUMPS & RUBELLA VAC IJ SOLR: 0.5000 mL | Freq: Once | INTRAMUSCULAR | Status: DC

## 2022-01-23 MED ORDER — BISACODYL 10 MG RE SUPP: 10.0000 mg | Freq: Every day | RECTAL | Status: DC | PRN

## 2022-01-23 MED ORDER — TETANUS-DIPHTH-ACELL PERTUSSIS 5-2.5-18.5 LF-MCG/0.5 IM SUSY: 0.5000 mL | PREFILLED_SYRINGE | Freq: Once | INTRAMUSCULAR | Status: DC

## 2022-01-23 MED ORDER — SIMETHICONE 80 MG PO CHEW: 80.0000 mg | CHEWABLE_TABLET | ORAL | Status: DC | PRN

## 2022-01-23 MED ORDER — FERROUS SULFATE 325 (65 FE) MG PO TABS
325.0000 mg | ORAL_TABLET | ORAL | Status: DC
  Administered 2022-01-23: 325 mg via ORAL
  Filled 2022-01-23: qty 1

## 2022-01-23 MED ORDER — METHYLERGONOVINE MALEATE 0.2 MG/ML IJ SOLN: 0.2000 mg | INTRAMUSCULAR | Status: DC | PRN

## 2022-01-23 MED ORDER — SENNOSIDES-DOCUSATE SODIUM 8.6-50 MG PO TABS
2.0000 | ORAL_TABLET | ORAL | Status: DC
  Administered 2022-01-23 – 2022-01-24 (×2): 2 via ORAL
  Filled 2022-01-23 (×2): qty 2

## 2022-01-23 MED ORDER — ACETAMINOPHEN 325 MG PO TABS: 650.0000 mg | ORAL_TABLET | ORAL | Status: DC | PRN

## 2022-01-23 MED ORDER — BENZOCAINE-MENTHOL 20-0.5 % EX AERO: 1.0000 | INHALATION_SPRAY | CUTANEOUS | Status: DC | PRN

## 2022-01-23 MED ORDER — FLEET ENEMA 7-19 GM/118ML RE ENEM: 1.0000 | ENEMA | Freq: Every day | RECTAL | Status: DC | PRN

## 2022-01-23 MED ORDER — DIPHENHYDRAMINE HCL 25 MG PO CAPS: 25.0000 mg | ORAL_CAPSULE | Freq: Four times a day (QID) | ORAL | Status: DC | PRN

## 2022-01-23 MED ORDER — IBUPROFEN 600 MG PO TABS
600.0000 mg | ORAL_TABLET | Freq: Four times a day (QID) | ORAL | Status: DC
  Administered 2022-01-23 – 2022-01-24 (×5): 600 mg via ORAL
  Filled 2022-01-23 (×6): qty 1

## 2022-01-23 MED ORDER — MEDROXYPROGESTERONE ACETATE 150 MG/ML IM SUSP: 150.0000 mg | INTRAMUSCULAR | Status: DC | PRN

## 2022-01-23 MED ORDER — DIBUCAINE (PERIANAL) 1 % EX OINT: 1.0000 | TOPICAL_OINTMENT | CUTANEOUS | Status: DC | PRN

## 2022-01-23 MED ORDER — METHYLERGONOVINE MALEATE 0.2 MG PO TABS: 0.2000 mg | ORAL_TABLET | ORAL | Status: DC | PRN

## 2022-01-23 MED ORDER — ONDANSETRON HCL 4 MG/2ML IJ SOLN: 4.0000 mg | INTRAMUSCULAR | Status: DC | PRN

## 2022-01-23 MED ORDER — PRENATAL MULTIVITAMIN CH
1.0000 | ORAL_TABLET | Freq: Every day | ORAL | Status: DC
  Administered 2022-01-23 – 2022-01-24 (×2): 1 via ORAL
  Filled 2022-01-23 (×2): qty 1

## 2022-01-23 MED ORDER — WITCH HAZEL-GLYCERIN EX PADS
1.0000 | MEDICATED_PAD | CUTANEOUS | Status: DC | PRN
  Administered 2022-01-23: 1 via TOPICAL

## 2022-01-23 NOTE — Lactation Note (Signed)
This note was copied from a baby's chart. Lactation Consultation Note  Patient Name: Sheena Lloyd [id number] Date: 01/23/2022 Reason for consult: L&D Initial assessment Age:36 hours LC entered the room, mom was doing skin to skin with infant. Mom latched infant on her left breast using the football hold position, infant latched with depth and breastfeed for 11 minutes. Mom will continue to breastfeed infant according to hunger cues, on demand, 8 to 12+ times within 24 hours, skin to skin. Mom knows to call RN/LC on MBU for further latch assistance if needed. LC congratulated parents on the birth of their daughter.  Maternal Data Does the patient have breastfeeding experience prior to this delivery?: Yes How long did the patient breastfeed?: Per mom, she breast feed all other 3 children for 1 month each due latch difficultes , had cracked and bleeding nipples.  Feeding Mother's Current Feeding Choice: Breast Milk  LATCH Score Latch: Grasps breast easily, tongue down, lips flanged, rhythmical sucking.  Audible Swallowing: Spontaneous and intermittent  Type of Nipple: Everted at rest and after stimulation  Comfort (Breast/Nipple): Soft / non-tender  Hold (Positioning): Assistance needed to correctly position infant at breast and maintain latch.  LATCH Score: 9   Lactation Tools Discussed/Used    Interventions Interventions: Assisted with latch;Skin to skin;Breast compression;Adjust position;Support pillows;Position options;Education  Discharge    Consult Status Consult Status: Follow-up from L&D    Danelle Earthly 01/23/2022, 1:02 AM

## 2022-01-23 NOTE — Anesthesia Postprocedure Evaluation (Signed)
Anesthesia Post Note  Patient: Kyndle Schlender  Procedure(s) Performed: AN AD HOC LABOR EPIDURAL     Patient location during evaluation: Mother Baby Anesthesia Type: Epidural Level of consciousness: awake and alert Pain management: pain level controlled Vital Signs Assessment: post-procedure vital signs reviewed and stable Respiratory status: spontaneous breathing, nonlabored ventilation and respiratory function stable Cardiovascular status: stable Postop Assessment: no headache, no backache, epidural receding, no apparent nausea or vomiting, patient able to bend at knees, adequate PO intake and able to ambulate Anesthetic complications: no   No notable events documented.  Last Vitals:  Vitals:   01/23/22 0247 01/23/22 0624  BP: 103/70 114/78  Pulse: 92 84  Resp: 16 18  Temp: 37.1 C 36.7 C  SpO2:  99%    Last Pain:  Vitals:   01/23/22 0800  TempSrc:   PainSc: 0-No pain   Pain Goal:                Epidural/Spinal Function Cutaneous sensation: Normal sensation (01/23/22 0800), Patient able to flex knees: Yes (01/23/22 0800), Patient able to lift hips off bed: Yes (01/23/22 0800), Back pain beyond tenderness at insertion site: No (01/23/22 0800), Progressively worsening motor and/or sensory loss: No (01/23/22 0800), Bowel and/or bladder incontinence post epidural: No (01/23/22 0800)  Donnalee Curry [place]

## 2022-01-23 NOTE — Discharge Summary (Signed)
Postpartum Discharge Summary  Date of Service updated***     Patient Name: Sheena Lloyd DOB: 11/08/1985 MRN: [id number]  Date of admission: 01/22/2022 Delivery date:01/23/2022  Delivering provider: Christin Fudge  Date of discharge: 01/23/2022  Admitting diagnosis: Indication for care in labor or delivery [O75.9] Intrauterine pregnancy: [redacted]w[redacted]d     Secondary diagnosis:  Principal Problem:   Indication for care in labor or delivery  Additional problems: oligohydramnios    Discharge diagnosis: Term Pregnancy Delivered                                              Post partum procedures:{Postpartum procedures:23558} Augmentation: AROM, Pitocin, and IP Foley Complications: None  Hospital course: Induction of Labor With Vaginal Delivery   36 y.o. yo [phone] at [redacted]w[redacted]d was admitted to the hospital 01/22/2022 for induction of labor.  Indication for induction:  oligohydramnios .  Patient had an uncomplicated labor course as follows: Membrane Rupture Time/Date: 6:58 PM ,01/22/2022   Delivery Method:Vaginal, Spontaneous  Episiotomy: None  Lacerations:    Details of delivery can be found in separate delivery note.  Patient had a routine postpartum course. Patient is discharged home 01/23/22.  Newborn Data: Birth date:01/23/2022  Birth time:12:04 AM  Gender:Female  Living status:Living  Apgars: ,  Weight:   Magnesium Sulfate received: No BMZ received: No Rhophylac:N/A MMR:N/A T-DaP:Given prenatally Flu: N/A Transfusion:{Transfusion received:30440034}  Physical exam  Vitals:   01/22/22 2353 01/23/22 0001 01/23/22 0021 01/23/22 0027  BP: 132/68 130/62 119/67 116/75  Pulse: 93 97 99 (!) 113  Resp:      Temp:      TempSrc:      SpO2: 91%     Weight:      Height:       General: {Exam; general:21111117} Lochia: {Desc; appropriate/inappropriate:30686::"appropriate"} Uterine Fundus: {Desc; firm/soft:30687} Incision: {Exam; incision:21111123} DVT Evaluation:  {Exam; dvt:2111122} Labs: Lab Results  Component Value Date   WBC 7.9 01/22/2022   HGB 10.7 (L) 01/22/2022   HCT 32.2 (L) 01/22/2022   MCV 90.4 01/22/2022   PLT 243 01/22/2022       No data to display         Edinburgh Score:     No data to display           After visit meds:  Allergies as of 01/23/2022   No Known Allergies   Med Rec must be completed prior to using this [facility]***        Discharge home in stable condition Infant Feeding: {Baby feeding:23562} Infant Disposition:{CHL IP OB HOME WITH WLNLGX:21194} Discharge instruction: per After Visit Summary and Postpartum booklet. Activity: Advance as tolerated. Pelvic rest for 6 weeks.  Diet: {OB RDEY:81448185} Future Appointments: Future Appointments  Date Time Provider [place]  01/23/2022 10:35 AM Vic Ripper [facility]  01/30/2022 10:35 AM Donnamae Jude, MD [facility]  02/05/2022  9:35 AM Gabriel Carina, CNM [facility]   Follow up Visit:   Please schedule this patient for a Virtual postpartum visit in 4 weeks with the following provider: Any provider. Additional Postpartum F/U:  Low risk pregnancy complicated by: oligohydramnios Delivery mode:  Vaginal, Spontaneous  Anticipated Birth Control:  POPs   01/23/2022 Christin Fudge, CNM

## 2022-01-23 NOTE — Lactation Note (Addendum)
This note was copied from a baby's chart. Lactation Consultation Note  Patient Name: Sheena Lloyd [id number] Date: 01/23/2022 Reason for consult: Initial assessment;Early term 37-38.6wks;Infant < 6lbs Age:36 hours  P4, Mother speaks english and declined interpretation. Mother plans to breastfeed and formula feed. Reviewed hand expressed and offered mother DEBP and she declined. Encouraged mother to wake baby for feedings if she does not cues at q 3hours. Offer breast first (Limit to 10-15 min) and then supplement with formula.  Reviewed volume guidelines with more volume if baby desires. Feed on demand with cues.  Goal 8-12+ times per day after first 24 hrs.  Place baby STS if not cueing.  Mom made aware of O/P services, breastfeeding support groups, our phone # for post-discharge questions.  Suggest calling for LC to view latch.  Returned to room to observe latch. Baby latched briefly and then fell asleep.  Suggest taking baby off and not letting her hang out and give supplement 5-10 ml. Family stated they plan to purchase a DEBP. Placed baby STS on mother's chest.    Maternal Data Has patient been taught Hand Expression?: Yes How long did the patient breastfeed?: one month  Feeding Mother's Current Feeding Choice: Breast Milk and Formula Nipple Type: Slow - flow  Interventions Interventions: Education;LC Services brochure;LPT handout/interventions  Discharge Pump: Declined  Consult Status Consult Status: Follow-up Date: 01/24/22 Follow-up type: In-patient    Dahlia Byes [facility] 01/23/2022, 9:31 AM

## 2022-01-24 ENCOUNTER — Ambulatory Visit: Payer: Self-pay

## 2022-01-24 LAB — BIRTH TISSUE RECOVERY COLLECTION (PLACENTA DONATION)

## 2022-01-24 NOTE — Lactation Note (Addendum)
This note was copied from a baby's chart. Lactation Consultation Note  Patient Name: Sheena Lloyd [id number] Date: 01/24/2022 Reason for consult: Follow-up assessment;Early term 37-38.6wks (-2% weight loss) Age:36 hours P4, ETI female infant with -2% weight loss. Infant is breastfeeding and supplementing with formula Mom concern she doesn't have any colostrum, mom hand express and saw colostrum, mom open to using DEBP, when mom pump she did express colostrum in breast flange. Mom latched infant on her left breast using the football position with depth, discussed stimulation techniques to keep infant awake while BF, infant BF for 18 minutes. Afterwards infant was given 2 mls of colostrum that mom expressed from using DEBP by spoon and 15 mls of formula using yellow slow flow bottle nipple.  Mom will keep using DEBP, pump every 3 hours for 15 minutes on initial setting. Mom knows her EBM is safe for 4 hours at room temperature whereas formula is safe for 1 hour. Mom's current plan: 1- Mom will latch infant to breast every feeding according to hunger cues, 8 to 12 x with 24 hours, STS. 2- Afterwards she give infant any EBM that she pumped first and then supplement infant with formula.  3- Mom knows based on infant's age supplement amount is ( 7-12 mls ) if latched and no latched 15-30 mls per feeding. 4- Mom knows to call RN/LC if she has any BF questions, concerns or needs latch assistance.  Maternal Data    Feeding Mother's Current Feeding Choice: Breast Milk and Formula  LATCH Score Latch: Grasps breast easily, tongue down, lips flanged, rhythmical sucking.  Audible Swallowing: Spontaneous and intermittent  Type of Nipple: Everted at rest and after stimulation  Comfort (Breast/Nipple): Soft / non-tender  Hold (Positioning): Assistance needed to correctly position infant at breast and maintain latch.  LATCH Score: 9   Lactation Tools Discussed/Used Tools: Pump Breast  pump type: Double-Electric Breast Pump Pump Education: Setup, frequency, and cleaning;Milk Storage Reason for Pumping: Mom open to pumping today, Pumping frequency: Mom will continue to pump every 3 hours for 15 minutes on inital setting. Pumped volume: 2 mL (spoon fed to infant.)  Interventions Interventions: Assisted with latch;Skin to skin;Breast compression;Adjust position;Support pillows;Position options;Expressed milk;DEBP;Education  Discharge    Consult Status Consult Status: Follow-up Date: 01/25/22 Follow-up type: In-patient    Danelle Earthly 01/24/2022, 4:36 PM

## 2022-01-25 ENCOUNTER — Ambulatory Visit: Payer: Self-pay

## 2022-01-25 NOTE — Lactation Note (Signed)
This note was copied from a baby's chart. Lactation Consultation Note  Patient Name: Sheena Lloyd Date: 01/25/2022 Reason for consult: Follow-up assessment;Early term 37-38.6wks;Infant < 6lbs Age:36 hours  LC in to visit with P4 Mom of ET infant on day of discharge.  Baby is being supplemented with formula by bottle after every breastfeeding.  Baby is at a 1% weight loss.  Mom has a DEBP set up in room but has not used it per her choice.  Mom doesn't have a pump at home, but plans to purchase one.  Mom shown how to make the hand pump from her pump parts and encouraged her to take the parts home.  Mom has WIC, LC offered to provide her with a [facility] loaner but Mom declined.  Engorgement prevention and treatment reviewed.  Mom aware of OP lactation support available to her.  Recommended she have an OP lactation appt, but Mom declined.  Mom states she will call prn for guidance and assistance.   Lactation Tools Discussed/Used Tools: Pump Breast pump type: Manual Pumping frequency: Mom is not pumping per her choice  Interventions Interventions: Breast feeding basics reviewed;Skin to skin;Breast massage;Hand express;Hand pump  Discharge Discharge Education: Engorgement and breast care;Warning signs for feeding baby;Outpatient recommendation Pump: Manual WIC Program: Yes  Consult Status Consult Status: Complete (mother declined follow up) Date: 01/25/22 Follow-up type: Call as needed    Sheena Lloyd 01/25/2022, 12:39 PM

## 2022-01-30 ENCOUNTER — Encounter: Payer: Self-pay | Admitting: Family Medicine

## 2022-01-31 ENCOUNTER — Telehealth (HOSPITAL_COMMUNITY): Payer: Self-pay

## 2022-01-31 NOTE — Telephone Encounter (Signed)
Patient reports that things are going well. "I'm having no pain, everything is fine." Patient declines questions/concerns about her health and healing.  Patient reports that baby is doing fine. Eating, peeing/pooping, and gaining weight well. Baby sleeps in a crib. RN reviewed ABC's of safe sleep with patient. Patient declines any questions or concerns about baby.  EPDS score is 0.  Marcelino Duster [facility]  01/31/22,0915

## 2022-02-05 ENCOUNTER — Encounter: Payer: Self-pay | Admitting: Certified Nurse Midwife

## 2022-03-05 ENCOUNTER — Encounter: Payer: Self-pay | Admitting: Student

## 2022-03-05 ENCOUNTER — Ambulatory Visit (INDEPENDENT_AMBULATORY_CARE_PROVIDER_SITE_OTHER): Payer: Self-pay | Admitting: Student

## 2022-03-05 NOTE — Progress Notes (Addendum)
Post Partum Visit Note  Sheena Lloyd is a 36 y.o. [phone] female who presents for a postpartum visit. She is 6 weeks postpartum following a normal spontaneous vaginal delivery.  I have fully reviewed the prenatal and intrapartum course. The delivery was at 37.6 gestational weeks.  Anesthesia: epidural. Postpartum course has been uneventful. Baby is doing well. Baby is feeding by bottle - Enfamil . Bleeding  started period . Bowel function is normal. Bladder function is normal. Patient is not sexually active. Contraception method is none. Postpartum depression screening: negative.   The pregnancy intention screening data noted above was reviewed. Potential methods of contraception were discussed. The patient elected to proceed with No data recorded.   Edinburgh Postnatal Depression Scale - 03/05/22 1616       Edinburgh Postnatal Depression Scale:  In the Past 7 Days   I have been able to laugh and see the funny side of things. 0    I have looked forward with enjoyment to things. 0    I have blamed myself unnecessarily when things went wrong. 0    I have been anxious or worried for no good reason. 0    I have felt scared or panicky for no good reason. 0    Things have been getting on top of me. 0    I have been so unhappy that I have had difficulty sleeping. 0    I have felt sad or miserable. 0    I have been so unhappy that I have been crying. 0    The thought of harming myself has occurred to me. 0    Edinburgh Postnatal Depression Scale Total 0             There are no preventive care reminders to display for this patient.  The following portions of the patient's history were reviewed and updated as appropriate: allergies, current medications, past family history, past medical history, past social history, past surgical history, and problem list.  Review of Systems Pertinent items are noted in HPI.  Objective:  BP 115/67   Pulse 90   Wt 205 lb (93 kg)   LMP  05/25/2021 (Exact Date)   Breastfeeding No   BMI 37.49 kg/m    General:  alert, cooperative, and no distress   Breasts:  normal  Lungs: clear to auscultation bilaterally  Heart:  regular rate and rhythm, S1, S2 normal, no murmur, click, rub or gallop  Abdomen: soft, non-tender; bowel sounds normal; no masses,  no organomegaly   Wound well approximated incision  GU exam:  normal       Assessment:    Healthy postpartum exam.   Plan:   Essential components of care per ACOG recommendations:  1.  Mood and well being: Patient with negative depression screening today. Reviewed local resources for support.  - Patient tobacco use? No.   - hx of drug use? No.    2. Infant care and feeding:  -Patient currently breastmilk feeding? No.  -Social determinants of health (SDOH) reviewed in EPIC. No concerns The following needs were identified  3. Sexuality, contraception and birth spacing - Patient does not want a pregnancy in the next year.  Desired family size is 4 children.  - Reviewed reproductive life planning. Reviewed contraceptive methods based on pt preferences and effectiveness.  Patient desired Oral Contraceptive today.  She will order her pills from [facility].  - Discussed birth spacing of 18 months  4. Sleep and fatigue -  Encouraged family/partner/community support of 4 hrs of uninterrupted sleep to help with mood and fatigue  5. Physical Recovery  - Discussed patients delivery and complications. She describes her labor as good. - Patient had a Vaginal, no problems at delivery. Patient had a  none  laceration. Perineal healing reviewed. Patient expressed understanding - Patient has urinary incontinence? No. - Patient is safe to resume physical and sexual activity  6.  Health Maintenance - HM due items addressed No - needs PP colpo . Message sent to [facility] to schedule.  - Last pap smear  Diagnosis  Date Value Ref Range Status  09/02/2021 (A)  Final   - Atypical squamous  cells of undetermined significance (ASC-US)   Pap smear not done at today's visit.  -Breast Cancer screening indicated? No.   7. Chronic Disease/Pregnancy Condition follow up: None  - PCP follow up  Marylene Land, CNM [facility], [facility]

## 2022-03-30 ENCOUNTER — Telehealth: Payer: Self-pay

## 2022-03-30 NOTE — Telephone Encounter (Signed)
Attempted to contact patient via [place] [phone] to schedule appointment for patient via referral from [facility]. Left name and number for patient to call back.

## 2022-05-07 ENCOUNTER — Telehealth: Payer: Self-pay

## 2022-05-07 ENCOUNTER — Ambulatory Visit: Payer: Self-pay | Admitting: Student

## 2022-05-07 NOTE — Telephone Encounter (Addendum)
Called pt to verify insurance status for PAP smear tomorrow. Pt was previously referred to [facility] for PP colposcopy but [facility] was unable to contact her.  Pt states she has Medicaid, but when office staff ran insurance, shows only valid for month of July. Pt agreeable to be contacted by [facility] again.

## 2022-05-08 ENCOUNTER — Ambulatory Visit: Payer: Self-pay | Admitting: Advanced Practice Midwife

## 2022-06-15 ENCOUNTER — Other Ambulatory Visit: Payer: Self-pay | Admitting: Hematology and Oncology

## 2022-06-15 DIAGNOSIS — Z124 Encounter for screening for malignant neoplasm of cervix: Secondary | ICD-10-CM

## 2022-06-15 NOTE — Progress Notes (Signed)
Patient: Denisha Hoel           Date of Birth: September 06, 1985           MRN: [id number] Visit Date: 06/15/2022 PCP: Pcp, No     Cervical Exam  Abnormal Observations: Normal exam.  Recommendations: Will refer for colpo if results remain abnormal with positive HPV.      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

## 2022-06-18 LAB — CYTOLOGY - PAP
Adequacy: ABSENT
Comment: NEGATIVE
Diagnosis: REACTIVE
High risk HPV: NEGATIVE

## 2022-06-22 ENCOUNTER — Telehealth: Payer: Self-pay

## 2022-06-22 NOTE — Telephone Encounter (Signed)
I have called the pt with a [employer] Interpreter, [place] ID: [phone], and advised the pt of her normal PAP/HPV results. Pt understands she needs to repeat her PAP in one year given her hx of abnormal PAP in Feb. 2023.

## 2023-01-25 ENCOUNTER — Ambulatory Visit (HOSPITAL_COMMUNITY)
Admission: EM | Admit: 2023-01-25 | Discharge: 2023-01-25 | Disposition: A | Discharge status: Home / Self Care | Payer: 59 | Attending: Emergency Medicine | Admitting: Emergency Medicine

## 2023-01-25 ENCOUNTER — Encounter (HOSPITAL_COMMUNITY): Payer: Self-pay

## 2023-01-25 DIAGNOSIS — H1033 Unspecified acute conjunctivitis, bilateral: Secondary | ICD-10-CM | POA: Diagnosis not present

## 2023-01-25 MED ORDER — MOXIFLOXACIN HCL 0.5 % OP SOLN: 1.0000 [drp] | Freq: Three times a day (TID) | OPHTHALMIC | 0 refills | Status: AC

## 2023-01-25 NOTE — ED Triage Notes (Addendum)
Patient c/o bilateral eye redness and states her eyes were matted together x 1 week. Patient added that she has had blurref vision in the pst few days.  Patient has been using tea bags for her symptoms.

## 2023-01-25 NOTE — Discharge Instructions (Signed)
Please use the eye drops in both eyes. Use three times every day for the next 7 days Wash your hands frequently and avoid touching your eyes

## 2023-01-25 NOTE — ED Provider Notes (Signed)
[facility]    CSN: [id number] Arrival date & time: 01/25/23  4098      History   Chief Complaint Chief Complaint  Patient presents with   Eye Problem    HPI Sheena Lloyd is a 37 y.o. female.  Here with 1 week history of bilateral eye redness.  This morning the left eye had some yellow discharge, patient describes as sticky. Reports vision is a little blurry. Eyes are not itchy or painful. No swelling of eyelids or face. No fevers Does not wear contacts  Her baby had similar symptoms 2 weeks ago and was dx with bacterial conjunctivitis, given eye drops.  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 Authorizing Provider  moxifloxacin (VIGAMOX) 0.5 % ophthalmic solution Place 1 drop into both eyes 3 (three) times daily for 7 days. 01/25/23 02/01/23 Yes Sherill Mangen, Lurena Joiner, PA-C  Prenatal MV & Min w/FA-DHA (PRENATAL ADULT GUMMY/DHA/FA PO) Take by mouth.    [provider]    Family History Family History  Problem Relation Age of Onset   Heart disease Father    Diabetes Neg Hx    Hypertension Neg Hx    Stroke Neg Hx    Asthma Neg Hx    Cancer Neg Hx    Birth defects Neg Hx     Social History Social History   Tobacco Use   Smoking status: Never   Smokeless tobacco: Never  Vaping Use   Vaping status: Never Used  Substance Use Topics   Alcohol use: Never   Drug  use: Never     Allergies   Patient has no known allergies.   Review of Systems Review of Systems As per HPI  Physical Exam Triage Vital Signs ED Triage Vitals  Encounter Vitals Group     BP 01/25/23 1023 113/76     Systolic BP Percentile --      Diastolic BP Percentile --      Pulse Rate 01/25/23 1023 60     Resp 01/25/23 1023 16     Temp 01/25/23 1023 98.9 F (37.2 C)     Temp Source 01/25/23 1023 Oral     SpO2 01/25/23 1023 99 %     Weight --      Height --      Head Circumference --      Peak Flow --      Pain Score 01/25/23 1025 0     Pain Loc --      Pain Education --      Exclude from Growth Chart --  No data found.  Updated Vital Signs BP 113/76 (BP Location: Right Arm)   Pulse 60   Temp 98.9 F (37.2 C) (Oral)   Resp 16   LMP 01/04/2023 (Approximate)   SpO2 99%   Visual Acuity Right Eye Distance: 20/50 Left Eye Distance: 20/50 Bilateral Distance: 20/40  Right Eye Near:   Left Eye Near:    Bilateral Near:     Physical Exam Vitals and nursing note reviewed.  Constitutional:      General: She is not in acute distress.    Appearance: Normal appearance.  HENT:     Right Ear: Tympanic membrane and ear canal normal.     Left Ear: Tympanic membrane and ear canal normal.     Mouth/Throat:     Mouth: Mucous membranes are moist.     Pharynx: Oropharynx is clear. No posterior oropharyngeal erythema.  Eyes:     General: Lids are normal. Vision grossly intact. Gaze aligned appropriately.        Right eye: No discharge.        Left eye: No discharge.     Extraocular Movements: Extraocular movements intact.     Conjunctiva/sclera:     Right eye: Right conjunctiva is injected.     Left eye: Left conjunctiva is injected.     Pupils: Pupils are equal, round, and reactive to light.     Comments: Bilat conjunctiva injected, L > R. No discharge or crusting on exam. No pain with EOM. No periorbital swelling or erythema   Cardiovascular:     Rate and  Rhythm: Normal rate and regular rhythm.     Heart sounds: Normal heart sounds.  Pulmonary:     Effort: Pulmonary effort is normal.     Breath sounds: Normal breath sounds.  Musculoskeletal:     Cervical back: Normal range of motion.  Skin:    General: Skin is warm and dry.  Neurological:     Mental Status: She is alert and oriented to person, place, and time.     UC Treatments / Results  Labs (all labs ordered are listed, but only abnormal results are displayed) Labs Reviewed - No data to display  EKG   Radiology No results found.  Procedures Procedures (including critical care time)  Medications Ordered in UC Medications - No data to display  Initial Impression / Assessment and Plan / UC Course  I have reviewed the triage vital signs and the nursing notes.  Pertinent labs & imaging results that were available during my care of the patient were reviewed by me and considered in my medical decision making (see chart for details).  Bilateral bacterial conjunctivitis.  No discharge on exam but patient reports yellow drainage this morning.  Will cover with Vigamox 3 times daily for 7 days.  No red flags, no concern for infection requiring oral antibiotics.  Discussed return precautions, patient is agreeable to plan, all questions answered.  Final Clinical Impressions(s) / UC Diagnoses   Final diagnoses:  Acute bacterial conjunctivitis of both eyes     Discharge Instructions      Please use the eye drops in both eyes. Use three times every day for the next 7 days Wash your hands frequently and avoid touching your eyes    ED Prescriptions     Medication Sig Dispense Auth. Provider   moxifloxacin (VIGAMOX) 0.5 % ophthalmic solution Place 1 drop into both eyes 3 (three) times daily for 7 days. 3 mL Zyden Suman, Lurena Joiner, PA-C  PDMP not reviewed this encounter.   Wetona Viramontes, Lurena Joiner, [place] 01/25/23 1104

## 2023-02-04 DIAGNOSIS — Z6841 Body Mass Index (BMI) 40.0 and over, adult: Secondary | ICD-10-CM | POA: Diagnosis not present

## 2023-02-04 DIAGNOSIS — Z91013 Allergy to seafood: Secondary | ICD-10-CM | POA: Diagnosis not present

## 2023-04-03 ENCOUNTER — Encounter (HOSPITAL_COMMUNITY): Payer: Self-pay | Admitting: Emergency Medicine

## 2023-04-03 ENCOUNTER — Ambulatory Visit (HOSPITAL_COMMUNITY)
Admission: EM | Admit: 2023-04-03 | Discharge: 2023-04-03 | Disposition: A | Discharge status: Home / Self Care | Payer: 59 | Attending: Emergency Medicine | Admitting: Emergency Medicine

## 2023-04-03 DIAGNOSIS — S80862A Insect bite (nonvenomous), left lower leg, initial encounter: Secondary | ICD-10-CM

## 2023-04-03 DIAGNOSIS — S40862A Insect bite (nonvenomous) of left upper arm, initial encounter: Secondary | ICD-10-CM | POA: Diagnosis not present

## 2023-04-03 DIAGNOSIS — W57XXXA Bitten or stung by nonvenomous insect and other nonvenomous arthropods, initial encounter: Secondary | ICD-10-CM | POA: Diagnosis not present

## 2023-04-03 MED ORDER — TRIAMCINOLONE ACETONIDE 0.1 % EX CREA: 1.0000 | TOPICAL_CREAM | Freq: Two times a day (BID) | CUTANEOUS | 0 refills | Status: AC | PRN

## 2023-04-03 NOTE — Discharge Instructions (Addendum)
May take Benadryl every 4 hours as needed for itching.  Apply triamcinolone thin layer twice daily.  Try to avoid scratching the areas and monitor for any signs of infection. Consider using bug spray prior to going outside and gardening.

## 2023-04-03 NOTE — ED Provider Notes (Signed)
[facility]    CSN: [id number] Arrival date & time: 04/03/23  1156      History   Chief Complaint Chief Complaint  Patient presents with   Rash    HPI Sheena Lloyd is a 37 y.o. female.   Patient reporting various areas on her neck arms legs that have spots of extreme itching.  The history is provided by the patient.  Rash   Past Medical History:  Diagnosis Date   Medical history non-contributory     Patient Active Problem List   Diagnosis Date Noted   ASCUS with positive high risk HPV cervical 09/30/2021   Group B streptococcal bacteriuria 09/08/2021    Past Surgical History:  Procedure Laterality Date   NO PAST SURGERIES      OB History     Gravida  4   Para  4   Term  4   Preterm      AB      Living  4      SAB      IAB      Ectopic      Multiple  0   Live Births  1            Home Medications    Prior to Admission medications   Medication Sig Start Date End Date Taking? Authorizing Provider  triamcinolone cream (KENALOG) 0.1 % Apply 1 Application topically 2 (two) times daily as needed (for itching). 04/03/23  Yes Brayten Komar, Linde Gillis, NP  Prenatal MV & Min w/FA-DHA (PRENATAL ADULT GUMMY/DHA/FA PO) Take by mouth.    [provider]    Family History Family History  Problem Relation Age of Onset   Heart disease Father    Diabetes Neg Hx    Hypertension Neg Hx    Stroke Neg Hx    Asthma Neg Hx    Cancer Neg Hx    Birth defects Neg Hx     Social History Social History   Tobacco Use   Smoking status: Never   Smokeless tobacco: Never  Vaping Use   Vaping status: Never Used  Substance Use Topics   Alcohol use: Never   Drug use: Never     Allergies   Patient has no known allergies.   Review of Systems Review of Systems  Skin:  Positive for rash.       Various area to include side of left neck, back of right arm, anterior left thigh, and lower right and left leg.  All other systems  reviewed and are negative.    Physical Exam Triage Vital Signs ED Triage Vitals  Encounter Vitals Group     BP 04/03/23 1220 123/84     Systolic BP Percentile --      Diastolic BP Percentile --      Pulse Rate 04/03/23 1220 61     Resp 04/03/23 1220 17     Temp 04/03/23 1220 98.5 F (36.9 C)     Temp Source 04/03/23 1220 Oral     SpO2 04/03/23 1220 99 %     Weight --      Height --      Head Circumference --      Peak Flow --      Pain Score 04/03/23 1219 0     Pain Loc --      Pain Education --      Exclude from Growth Chart --    No data found.  Updated Vital  Signs BP 123/84 (BP Location: Right Arm)   Pulse 61   Temp 98.5 F (36.9 C) (Oral)   Resp 17   LMP 03/28/2023 (Exact Date)   SpO2 99%   Visual Acuity Right Eye Distance:   Left Eye Distance:   Bilateral Distance:    Right Eye Near:   Left Eye Near:    Bilateral Near:     Physical Exam Vitals and nursing note reviewed.  Skin:    Comments: Multiple insect bites.  Macerated from scratching.  Various stages of healing  Neurological:     Mental Status: She is alert.      UC Treatments / Results  Labs (all labs ordered are listed, but only abnormal results are displayed) Labs Reviewed - No data to display  EKG   Radiology No results found.  Procedures Procedures (including critical care time)  Medications Ordered in UC Medications - No data to display  Initial Impression / Assessment and Plan / UC Course  I have reviewed the triage vital signs and the nursing notes.  Pertinent labs & imaging results that were available during my care of the patient were reviewed by me and considered in my medical decision making (see chart for details). Patient reporting multiple areas of itching and bumps to include left neck left lower leg left thigh right upper arm.  She reports that she has been in her garden outside and itching did occur after being outside. 1.  Insect bite without signs of  infection.  Triamcinolone and Benadryl as needed for itching.  Patient educated to keep areas clean and dry.  Monitor for signs of infection.    Final Clinical Impressions(s) / UC Diagnoses   Final diagnoses:  Insect bite, unspecified site, initial encounter     Discharge Instructions      May take Benadryl every 4 hours as needed for itching.  Apply triamcinolone thin layer twice daily.  Try to avoid scratching the areas and monitor for any signs of infection. Consider using bug spray prior to going outside and gardening.     ED Prescriptions     Medication Sig Dispense Auth. Provider   triamcinolone cream (KENALOG) 0.1 % Apply 1 Application topically 2 (two) times daily as needed (for itching). 30 g Cynthea Zachman, Linde Gillis, NP      PDMP not reviewed this encounter.   Nelda Marseille, NP 04/03/23 1243

## 2023-04-03 NOTE — ED Triage Notes (Signed)
Pt reports started having bump that is itching which thought was mosquito bite but now all over arms and legs. Took Allergy medications.

## 2024-01-12 ENCOUNTER — Encounter: Payer: Self-pay | Admitting: Family Medicine

## 2024-01-12 ENCOUNTER — Ambulatory Visit (INDEPENDENT_AMBULATORY_CARE_PROVIDER_SITE_OTHER): Payer: Self-pay | Admitting: Family Medicine

## 2024-01-12 VITALS — BP 114/80 | HR 83 | Ht 62.0 in | Wt 229.4 lb

## 2024-01-12 DIAGNOSIS — Z0000 Encounter for general adult medical examination without abnormal findings: Secondary | ICD-10-CM

## 2024-01-12 DIAGNOSIS — Z7689 Persons encountering health services in other specified circumstances: Secondary | ICD-10-CM

## 2024-01-12 DIAGNOSIS — Z13228 Encounter for screening for other metabolic disorders: Secondary | ICD-10-CM

## 2024-01-12 DIAGNOSIS — Z130 Encounter for screening for diseases of the blood and blood-forming organs and certain disorders involving the immune mechanism: Secondary | ICD-10-CM

## 2024-01-12 DIAGNOSIS — Z1329 Encounter for screening for other suspected endocrine disorder: Secondary | ICD-10-CM

## 2024-01-12 DIAGNOSIS — Z13220 Encounter for screening for lipoid disorders: Secondary | ICD-10-CM

## 2024-01-12 NOTE — Progress Notes (Signed)
 New Patient Office Visit  Subjective    Patient ID: Sheena Lloyd, female    DOB: Jul 24, 1985  Age: 38 y.o. MRN: [id number]  CC:  Chief Complaint  Patient presents with   New Patient (Initial Visit)    No concerns    HPI Sheena Lloyd presents to establish care and for routine annual exam. Patient denies known chronic med issues or med and denies acute complaints.    Outpatient Encounter Medications as of 01/12/2024  Medication Sig   levonorgestrel-ethinyl estradiol (VIENVA) 0.1-20 MG-MCG tablet Take 1 tablet by mouth daily.   Prenatal MV & Min w/FA-DHA (PRENATAL ADULT GUMMY/DHA/FA PO) Take by mouth. (Patient not taking: Reported on 01/12/2024)   triamcinolone  cream (KENALOG ) 0.1 % Apply 1 Application topically 2 (two) times daily as needed (for itching). (Patient not taking: Reported on 01/12/2024)   No facility-administered encounter medications on file as of 01/12/2024.    Past Medical History:  Diagnosis Date   Medical history non-contributory     Past Surgical History:  Procedure Laterality Date   NO PAST SURGERIES      Family History  Problem Relation Age of Onset   Heart disease Father    Diabetes Neg Hx    Hypertension Neg Hx    Stroke Neg Hx    Asthma Neg Hx    Cancer Neg Hx    Birth defects Neg Hx     Social History   Socioeconomic History   Marital status: Married    Spouse name: Not on file   Number of children: Not on file   Years of education: Not on file   Highest education level: Not on file  Occupational History   Not on file  Tobacco Use   Smoking status: Never   Smokeless tobacco: Never  Vaping Use   Vaping status: Never Used  Substance and Sexual Activity   Alcohol use: Never   Drug use: Never   Sexual activity: Yes  Other Topics Concern   Not on file  Social History Narrative   Not on file   Social Drivers of Health   Financial Resource Strain: Not on file  Food Insecurity: No Food Insecurity (01/15/2022)   Hunger Vital  Sign    Worried About Running Out of Food in the Last Year: Never true    Ran Out of Food in the Last Year: Never true  Transportation Needs: No Transportation Needs (01/15/2022)   PRAPARE - Administrator, Civil Service (Medical): No    Lack of Transportation (Non-Medical): No  Physical Activity: Not on file  Stress: Not on file  Social Connections: Not on file  Intimate Partner Violence: Not on file    Review of Systems  All other systems reviewed and are negative.       Objective   BP 114/80   Pulse 83   Ht 5' 2 (1.575 m)   Wt 229 lb 6.4 oz (104.1 kg)   LMP 01/09/2024   SpO2 98%   BMI 41.96 kg/m   Physical Exam Vitals and nursing note reviewed.  Constitutional:      General: She is not in acute distress.    Appearance: She is obese.  HENT:     Head: Normocephalic and atraumatic.     Right Ear: Tympanic membrane, ear canal and external ear normal.     Left Ear: Tympanic membrane, ear canal and external ear normal.     Nose: Nose normal.     Mouth/Throat:  Mouth: Mucous membranes are moist.     Pharynx: Oropharynx is clear.  Eyes:     Conjunctiva/sclera: Conjunctivae normal.     Pupils: Pupils are equal, round, and reactive to light.  Neck:     Thyroid: No thyromegaly.  Cardiovascular:     Rate and Rhythm: Normal rate and regular rhythm.     Heart sounds: Normal heart sounds. No murmur heard. Pulmonary:     Effort: Pulmonary effort is normal. No respiratory distress.     Breath sounds: Normal breath sounds.  Abdominal:     General: There is no distension.     Palpations: Abdomen is soft. There is no mass.     Tenderness: There is no abdominal tenderness.  Musculoskeletal:        General: Normal range of motion.     Cervical back: Normal range of motion and neck supple.  Skin:    General: Skin is warm and dry.  Neurological:     General: No focal deficit present.     Mental Status: She is alert and oriented to person, place, and time.   Psychiatric:        Mood and Affect: Mood normal.        Behavior: Behavior normal.         Assessment & Plan:   Annual physical exam -     CMP14+EGFR  Encounter to establish care  Screening for deficiency anemia -     CBC with Differential/Platelet  Screening for lipid disorders -     Lipid panel  Screening for endocrine/metabolic/immunity disorders -     Hemoglobin A1c -     TSH     No follow-ups on file.   Tanda Raguel SQUIBB, MD

## 2024-01-19 ENCOUNTER — Other Ambulatory Visit: Payer: Self-pay

## 2024-01-20 ENCOUNTER — Ambulatory Visit: Payer: Self-pay | Admitting: Family Medicine

## 2024-01-20 LAB — CBC WITH DIFFERENTIAL/PLATELET
Basophils Absolute: 0 x10E3/uL (ref 0.0–0.2)
Basos: 1 %
EOS (ABSOLUTE): 0.1 x10E3/uL (ref 0.0–0.4)
Eos: 2 %
Hematocrit: 45.5 % (ref 34.0–46.6)
Hemoglobin: 14.6 g/dL (ref 11.1–15.9)
Immature Grans (Abs): 0 x10E3/uL (ref 0.0–0.1)
Immature Granulocytes: 0 %
Lymphocytes Absolute: 1.6 x10E3/uL (ref 0.7–3.1)
Lymphs: 26 %
MCH: 31.4 pg (ref 26.6–33.0)
MCHC: 32.1 g/dL (ref 31.5–35.7)
MCV: 98 fL — ABNORMAL HIGH (ref 79–97)
Monocytes Absolute: 0.4 x10E3/uL (ref 0.1–0.9)
Monocytes: 7 %
Neutrophils Absolute: 3.9 x10E3/uL (ref 1.4–7.0)
Neutrophils: 64 %
Platelets: 269 x10E3/uL (ref 150–450)
RBC: 4.65 x10E6/uL (ref 3.77–5.28)
RDW: 12.3 % (ref 11.7–15.4)
WBC: 6.1 x10E3/uL (ref 3.4–10.8)

## 2024-01-20 LAB — CMP14+EGFR
ALT: 17 IU/L (ref 0–32)
AST: 17 IU/L (ref 0–40)
Albumin: 4.3 g/dL (ref 3.9–4.9)
Alkaline Phosphatase: 93 IU/L (ref 44–121)
BUN/Creatinine Ratio: 10 (ref 9–23)
BUN: 7 mg/dL (ref 6–20)
Bilirubin Total: 0.3 mg/dL (ref 0.0–1.2)
CO2: 19 mmol/L — ABNORMAL LOW (ref 20–29)
Calcium: 9.1 mg/dL (ref 8.7–10.2)
Chloride: 103 mmol/L (ref 96–106)
Creatinine, Ser: 0.69 mg/dL (ref 0.57–1.00)
Globulin, Total: 3.9 g/dL (ref 1.5–4.5)
Glucose: 79 mg/dL (ref 70–99)
Potassium: 4.1 mmol/L (ref 3.5–5.2)
Sodium: 139 mmol/L (ref 134–144)
Total Protein: 8.2 g/dL (ref 6.0–8.5)
eGFR: 114 mL/min/1.73 (ref >59)

## 2024-01-20 LAB — LIPID PANEL
Chol/HDL Ratio: 3.7 ratio (ref 0.0–4.4)
Cholesterol, Total: 192 mg/dL (ref 100–199)
HDL: 52 mg/dL (ref >39)
LDL Chol Calc (NIH): 118 mg/dL — ABNORMAL HIGH (ref 0–99)
Triglycerides: 123 mg/dL (ref 0–149)
VLDL Cholesterol Cal: 22 mg/dL (ref 5–40)

## 2024-01-20 LAB — TSH: TSH: 1.33 u[IU]/mL (ref 0.450–4.500)

## 2024-01-20 LAB — HEMOGLOBIN A1C
Est. average glucose Bld gHb Est-mCnc: 97 mg/dL
Hgb A1c MFr Bld: 5 % (ref 4.8–5.6)
